# Patient Record
Sex: Female | Born: 1935 | Race: White | Hispanic: No | Smoking: Former smoker
Health system: Southern US, Community
[De-identification: ages and names within clinical notes are randomized; demographics above are authoritative.]

## PROBLEM LIST (undated history)

## (undated) DIAGNOSIS — Z8619 Personal history of other infectious and parasitic diseases: Secondary | ICD-10-CM

## (undated) DIAGNOSIS — E785 Hyperlipidemia, unspecified: Secondary | ICD-10-CM

## (undated) DIAGNOSIS — Z86718 Personal history of other venous thrombosis and embolism: Secondary | ICD-10-CM

## (undated) DIAGNOSIS — R32 Unspecified urinary incontinence: Secondary | ICD-10-CM

## (undated) DIAGNOSIS — Z87440 Personal history of urinary (tract) infections: Secondary | ICD-10-CM

## (undated) DIAGNOSIS — IMO0001 Reserved for inherently not codable concepts without codable children: Secondary | ICD-10-CM

## (undated) DIAGNOSIS — H3530 Unspecified macular degeneration: Secondary | ICD-10-CM

## (undated) DIAGNOSIS — I10 Essential (primary) hypertension: Secondary | ICD-10-CM

## (undated) DIAGNOSIS — M1990 Unspecified osteoarthritis, unspecified site: Secondary | ICD-10-CM

## (undated) DIAGNOSIS — H9190 Unspecified hearing loss, unspecified ear: Secondary | ICD-10-CM

## (undated) DIAGNOSIS — E119 Type 2 diabetes mellitus without complications: Secondary | ICD-10-CM

## (undated) HISTORY — DX: Hyperlipidemia, unspecified: E78.5

## (undated) HISTORY — DX: Unspecified osteoarthritis, unspecified site: M19.90

## (undated) HISTORY — DX: Personal history of urinary (tract) infections: Z87.440

## (undated) HISTORY — DX: Reserved for inherently not codable concepts without codable children: IMO0001

## (undated) HISTORY — DX: Personal history of other venous thrombosis and embolism: Z86.718

## (undated) HISTORY — DX: Unspecified urinary incontinence: R32

## (undated) HISTORY — DX: Essential (primary) hypertension: I10

## (undated) HISTORY — DX: Unspecified macular degeneration: H35.30

## (undated) HISTORY — DX: Type 2 diabetes mellitus without complications: E11.9

## (undated) HISTORY — DX: Personal history of other infectious and parasitic diseases: Z86.19

## (undated) HISTORY — DX: Unspecified hearing loss, unspecified ear: H91.90

## (undated) HISTORY — PX: TONSILLECTOMY AND ADENOIDECTOMY: SUR1326

---

## 1983-06-14 HISTORY — PX: ABDOMINAL HYSTERECTOMY: SHX81

## 2000-04-13 ENCOUNTER — Other Ambulatory Visit: Admission: RE | Admit: 2000-04-13 | Discharge: 2000-04-13 | Payer: Self-pay | Admitting: Family Medicine

## 2005-01-03 ENCOUNTER — Ambulatory Visit: Payer: Self-pay | Admitting: Ophthalmology

## 2005-01-12 ENCOUNTER — Ambulatory Visit: Payer: Self-pay | Admitting: Ophthalmology

## 2005-02-17 ENCOUNTER — Ambulatory Visit: Payer: Self-pay | Admitting: Ophthalmology

## 2005-02-23 ENCOUNTER — Ambulatory Visit: Payer: Self-pay | Admitting: Ophthalmology

## 2005-06-23 ENCOUNTER — Ambulatory Visit: Payer: Self-pay | Admitting: Internal Medicine

## 2005-07-04 ENCOUNTER — Ambulatory Visit: Payer: Self-pay | Admitting: Gastroenterology

## 2006-07-21 ENCOUNTER — Ambulatory Visit: Payer: Self-pay | Admitting: Internal Medicine

## 2007-04-04 ENCOUNTER — Ambulatory Visit: Payer: Self-pay | Admitting: Internal Medicine

## 2007-04-04 ENCOUNTER — Inpatient Hospital Stay: Payer: Self-pay | Admitting: Internal Medicine

## 2007-07-25 ENCOUNTER — Ambulatory Visit: Payer: Self-pay | Admitting: Internal Medicine

## 2007-11-27 ENCOUNTER — Inpatient Hospital Stay: Payer: Self-pay | Admitting: Internal Medicine

## 2007-11-27 ENCOUNTER — Other Ambulatory Visit: Payer: Self-pay

## 2008-07-31 ENCOUNTER — Ambulatory Visit: Payer: Self-pay | Admitting: Internal Medicine

## 2009-01-12 ENCOUNTER — Ambulatory Visit: Payer: Self-pay | Admitting: Sports Medicine

## 2009-06-13 DIAGNOSIS — R32 Unspecified urinary incontinence: Secondary | ICD-10-CM

## 2009-06-13 HISTORY — DX: Unspecified urinary incontinence: R32

## 2009-08-05 ENCOUNTER — Ambulatory Visit: Payer: Self-pay | Admitting: Internal Medicine

## 2009-09-17 ENCOUNTER — Emergency Department: Payer: Self-pay | Admitting: Emergency Medicine

## 2009-09-17 ENCOUNTER — Ambulatory Visit: Payer: Self-pay | Admitting: Internal Medicine

## 2010-03-11 ENCOUNTER — Ambulatory Visit: Payer: Self-pay | Admitting: Internal Medicine

## 2010-05-05 ENCOUNTER — Ambulatory Visit: Payer: Self-pay | Admitting: Nephrology

## 2010-06-13 HISTORY — PX: ANKLE FRACTURE SURGERY: SHX122

## 2010-07-23 ENCOUNTER — Ambulatory Visit: Payer: Self-pay

## 2010-08-26 ENCOUNTER — Ambulatory Visit: Payer: Self-pay | Admitting: Internal Medicine

## 2011-01-21 ENCOUNTER — Inpatient Hospital Stay: Payer: Self-pay | Admitting: Orthopedic Surgery

## 2011-01-21 DIAGNOSIS — I10 Essential (primary) hypertension: Secondary | ICD-10-CM

## 2011-06-14 DIAGNOSIS — H3530 Unspecified macular degeneration: Secondary | ICD-10-CM

## 2011-06-14 HISTORY — DX: Unspecified macular degeneration: H35.30

## 2011-07-14 ENCOUNTER — Encounter: Payer: Medicare Other | Admitting: Family Medicine

## 2011-07-15 ENCOUNTER — Ambulatory Visit (INDEPENDENT_AMBULATORY_CARE_PROVIDER_SITE_OTHER): Payer: Medicare Other | Admitting: Family Medicine

## 2011-07-15 ENCOUNTER — Encounter: Payer: Self-pay | Admitting: Family Medicine

## 2011-07-15 ENCOUNTER — Other Ambulatory Visit: Payer: Self-pay | Admitting: Family Medicine

## 2011-07-15 ENCOUNTER — Ambulatory Visit: Payer: Self-pay | Admitting: Unknown Physician Specialty

## 2011-07-15 ENCOUNTER — Ambulatory Visit: Payer: Medicare Other | Admitting: Family Medicine

## 2011-07-15 VITALS — BP 168/98 | HR 80 | Temp 98.3°F | Wt 168.2 lb

## 2011-07-15 DIAGNOSIS — I10 Essential (primary) hypertension: Secondary | ICD-10-CM | POA: Insufficient documentation

## 2011-07-15 DIAGNOSIS — M1990 Unspecified osteoarthritis, unspecified site: Secondary | ICD-10-CM | POA: Insufficient documentation

## 2011-07-15 DIAGNOSIS — H9190 Unspecified hearing loss, unspecified ear: Secondary | ICD-10-CM

## 2011-07-15 DIAGNOSIS — Z7901 Long term (current) use of anticoagulants: Secondary | ICD-10-CM

## 2011-07-15 DIAGNOSIS — R32 Unspecified urinary incontinence: Secondary | ICD-10-CM | POA: Insufficient documentation

## 2011-07-15 DIAGNOSIS — E119 Type 2 diabetes mellitus without complications: Secondary | ICD-10-CM | POA: Insufficient documentation

## 2011-07-15 DIAGNOSIS — Z9181 History of falling: Secondary | ICD-10-CM

## 2011-07-15 DIAGNOSIS — Z5181 Encounter for therapeutic drug level monitoring: Secondary | ICD-10-CM

## 2011-07-15 DIAGNOSIS — E785 Hyperlipidemia, unspecified: Secondary | ICD-10-CM

## 2011-07-15 DIAGNOSIS — I82409 Acute embolism and thrombosis of unspecified deep veins of unspecified lower extremity: Secondary | ICD-10-CM

## 2011-07-15 DIAGNOSIS — B353 Tinea pedis: Secondary | ICD-10-CM

## 2011-07-15 DIAGNOSIS — R296 Repeated falls: Secondary | ICD-10-CM

## 2011-07-15 DIAGNOSIS — M129 Arthropathy, unspecified: Secondary | ICD-10-CM

## 2011-07-15 DIAGNOSIS — Z86718 Personal history of other venous thrombosis and embolism: Secondary | ICD-10-CM | POA: Insufficient documentation

## 2011-07-15 LAB — POCT INR: INR: 2.9

## 2011-07-15 MED ORDER — CARVEDILOL 6.25 MG PO TABS: 6.2500 mg | ORAL_TABLET | Freq: Two times a day (BID) | ORAL | Status: DC

## 2011-07-15 NOTE — Patient Instructions (Signed)
Continue current dose, check in 4 weeks  

## 2011-07-15 NOTE — Assessment & Plan Note (Signed)
Await records, compliant with lovastatin.

## 2011-07-15 NOTE — Patient Instructions (Addendum)
Coumadin check today - goal INR 2-3. Stop tramadol. Take tylenol 1000mg  twice daily as needed instead. Use lotrimin between last two toes on left for possible early fungal infection. Pass by Marion's office for referral to home physical therapy. Increase carvedilol to 2 pills twice daily, new prescription will be for double dose (6.25) so one pill twice daily. Return to see me in 1 month, blood work then.

## 2011-07-15 NOTE — Progress Notes (Addendum)
Subjective:    Patient ID: Margaret Haynes, female    DOB: 1927/01/23, 78 y.o.   MRN: [id number]  HPI CC: new pt establish  Somewhat hard of hearing.  Prior saw Dr. Hetty Ely, then switched to Dr. Vickii Penna [facility] medical), not satisfied with care, was seeing PA/NP, wants to change providers.  Will pull old chart --> none available.  Presents with two daughters, Eather Colas and Liborio Nixon.  Homebound 2/2 fall risk.  Daughters worried about pt's ability to live at home alone.  Son intermittently stays with her at night but unsure how long he will be able to do this.  HTN - lumbar MRI this morning.  Walking more than normal.  Also meeting new doc.  Thinks has been well controlled in past on low dose coreg.  Does n't check at home.  No HA, vision changes, CP/tightness, SOB, leg swelling.  T2DM - diagnosed "some years ago", checks sugars qod.  Fasting 120s.  Last vision exam 09/2010.  Already scheduled for rpt.  Last foot exam - unsure.  No low readings.  Has never been on metformin or insulin.  HLD - on lovastatin.  No myalgias endorsed.  H/o DVT bilateral legs first 2011 then 2012.  On chronic coumadin for this.  Last INR check was 2.5 and done 06/20/2011.  Recurrent falls - Has had falls recently.  Last fall without injury was last week.  Fell and landed sitting on floor in bathroom.  Last fall with injury was 01/2011, broke both bones in right ankle.  S/p surgery with metal plates and screws present.  Ortho Dr. Gerrit Heck, wondering about spinal stenosis, MRI of back done today, considering ESI.  Started using walker last year.  Has completed HHPT several times.  In fall was in [facility] for rehab for 6 wks after ankle surgery, sent home 03/18/2011.  Currently working on getting HH involved again 2/2 recurrent falls.  Again, daughters worried about pt's ability to stay at home.  Denies dizziness, lightheadedness with falls.  States legs give out bilaterally, denies unilateral weakness,  paresthesias.  Preventative: Unsure last wellness exam, none recently. Blood work getting done every 3 months by prior PCP. DEXA - thinks done by prior PCP but never told had osteoporosis. Flu - 04/2011 Pneumonia shot - done Tetanus - unsure when, zostavax - has not had. mammo - 08/2010, gets yearly  Caffeine: 2 cups coffee/day Lives alone, son stays with her most nights, no pets Occupation: retired Activity: no regular exercise Diet: good water, daily fruits/vegetables, red meat rare  Medications and allergies reviewed and updated in chart.  Past histories reviewed and updated if relevant as below. There is no problem list on file for this patient.  Past Medical History  Diagnosis Date  . Arthritis     in back, ?remote vertebral fracture  . History of chicken pox   . T2DM (type 2 diabetes mellitus)   . HTN (hypertension)   . HLD (hyperlipidemia)   . History of DVT (deep vein thrombosis) 2011, 2012    bilateral legs  . Urine incontinence 2011  . Hx: UTI (urinary tract infection)     recurrent   Past Surgical History  Procedure Date  . Tonsillectomy and adenoidectomy childhood  . Abdominal hysterectomy 1985    bleeding after menopause?  Marland Kitchen Ankle fracture surgery 2012   History  Substance Use Topics  . Smoking status: Former Smoker    Quit date: 06/14/1959  . Smokeless tobacco: Never Used  . Alcohol  Use: No   Family History  Problem Relation Age of Onset  . Heart disease Mother   . Coronary artery disease Mother   . Coronary artery disease Father   . Drug abuse Father   . Diabetes Father   . Diabetes Sister   . Coronary artery disease Sister   . Cancer Brother   . Stroke Neg Hx    No Known Allergies No current outpatient prescriptions on file prior to visit.     Review of Systems  Constitutional: Negative for fever, chills, activity change, appetite change, fatigue and unexpected weight change.  HENT: Negative for hearing loss and neck pain.   Eyes:  Negative for visual disturbance.  Respiratory: Negative for cough, chest tightness, shortness of breath and wheezing.   Cardiovascular: Negative for chest pain, palpitations and leg swelling.  Gastrointestinal: Negative for nausea, vomiting, abdominal pain, diarrhea, constipation, blood in stool and abdominal distention.  Genitourinary: Negative for hematuria and difficulty urinating.  Musculoskeletal: Negative for myalgias and arthralgias.  Skin: Negative for rash.  Neurological: Negative for dizziness, seizures, syncope and headaches.  Hematological: Does not bruise/bleed easily.  Psychiatric/Behavioral: Negative for dysphoric mood. The patient is not nervous/anxious.        Objective:   Physical Exam  Nursing note and vitals reviewed. Constitutional: She is oriented to person, place, and time. She appears well-developed and well-nourished. No distress.       Unsteady on feet, overweight  HENT:  Head: Normocephalic and atraumatic.  Right Ear: Tympanic membrane, external ear and ear canal normal. Decreased hearing is noted.  Left Ear: Tympanic membrane, external ear and ear canal normal. Decreased hearing is noted.  Nose: Nose normal. No mucosal edema or rhinorrhea.  Mouth/Throat: Uvula is midline, oropharynx is clear and moist and mucous membranes are normal. No oropharyngeal exudate, posterior oropharyngeal edema, posterior oropharyngeal erythema or tonsillar abscesses.       Hearing aides present bilaterally  Eyes: Conjunctivae and EOM are normal. Pupils are equal, round, and reactive to light. No scleral icterus.  Neck: Normal range of motion. Neck supple. Carotid bruit is not present. No thyromegaly present.  Cardiovascular: Normal rate, regular rhythm, normal heart sounds and intact distal pulses.   No murmur heard. Pulses:      Radial pulses are 2+ on the right side, and 2+ on the left side.       Dorsalis pedis pulses are 2+ on the right side, and 2+ on the left side.        Posterior tibial pulses are 2+ on the right side, and 2+ on the left side.  Pulmonary/Chest: Effort normal and breath sounds normal. No respiratory distress. She has no wheezes. She has no rales.  Abdominal: Soft. Bowel sounds are normal. She exhibits no distension and no mass. There is no tenderness. There is no rebound and no guarding.  Musculoskeletal: Normal range of motion. She exhibits no edema.       Diabetic foot exam Deformity bilateral feet. Some skin breakdown left interdigital web space between 4th and 5th toes calluses present Normal DP/PT pulses Normal sensation to light touch and slightly diminised to monofilament Nails thickened, fungal  Lymphadenopathy:    She has no cervical adenopathy.  Neurological: She is alert and oriented to person, place, and time. She exhibits normal muscle tone. Coordination abnormal.       CN grossly intact, station and gait impaired.  Walks with rolling walker, very unsteady on feet, complete assistance needed to get on exam  table  Skin: Skin is warm and dry. No rash noted.       Left interdigital web between 4th-5th toes with maceration, scaling  Psychiatric: She has a normal mood and affect. Her behavior is normal. Judgment and thought content normal.      Assessment & Plan:

## 2011-07-16 DIAGNOSIS — B353 Tinea pedis: Secondary | ICD-10-CM | POA: Insufficient documentation

## 2011-07-16 NOTE — Assessment & Plan Note (Signed)
Goal INR 2-3 Get established with coumadin clinic today.

## 2011-07-16 NOTE — Assessment & Plan Note (Signed)
rec lotrimin cream.

## 2011-07-16 NOTE — Assessment & Plan Note (Signed)
Chronic. Seems well controlled on low dose amaryl per fasting cbgs reported.   Will do foot exam today, request records from prior pcp and check blood possibly next visit.

## 2011-07-16 NOTE — Assessment & Plan Note (Signed)
Elevated today but has been exherted more than normal today, new doctor office today. Increase coreg slightly today, await records

## 2011-07-16 NOTE — Assessment & Plan Note (Addendum)
Will refer to [facility] today for PT for home safety assessment and for assistance/treatment of gait instability, help minimize fall risk, social work, and nurse aid to help with meds and ensure taking appropriately. Anticipate recent ankle fracture s/p repair contributing some to recurrent falls. Med review performed - have asked pt stop tramadol (2/2 concern with falls and interaction with coumadin), asked to take tylenol for arthritis in back instead. A total of 60 minutes were spent face-to-face with the patient during this encounter and over half of that time was spent on counseling and coordination of care

## 2011-07-16 NOTE — Assessment & Plan Note (Addendum)
?   Remote vertebral fractures.  Will need to verify recent dexa scan, ?osteoporosis Await records from Dr. Gerrit Heck.

## 2011-07-18 ENCOUNTER — Ambulatory Visit: Payer: Medicare Other

## 2011-07-18 ENCOUNTER — Encounter: Payer: Medicare Other | Admitting: Family Medicine

## 2011-07-21 ENCOUNTER — Telehealth: Payer: Self-pay | Admitting: Family Medicine

## 2011-07-21 NOTE — Telephone Encounter (Signed)
Patient sees Dr.Chasnis for back pain.  She will be receiving epidural injections.  She needs to discontinue her coumadin 4 days prior to the injections.  Dr.Chasnis' office called her daughter and asked her to call you and ask you to fax a letter to them stating it's ok for patient to discontinue coumadin 4 days prior to injection.  The fax [phone].

## 2011-07-21 NOTE — Telephone Encounter (Signed)
Wrote letter and routed to [employer].

## 2011-07-25 NOTE — Telephone Encounter (Signed)
Note faxed to Dr. Yves Dill' office.

## 2011-08-12 ENCOUNTER — Other Ambulatory Visit: Payer: Self-pay | Admitting: Family Medicine

## 2011-08-12 ENCOUNTER — Ambulatory Visit (INDEPENDENT_AMBULATORY_CARE_PROVIDER_SITE_OTHER): Payer: Medicare Other | Admitting: Family Medicine

## 2011-08-12 ENCOUNTER — Encounter: Payer: Self-pay | Admitting: Family Medicine

## 2011-08-12 VITALS — BP 182/100 | HR 92 | Temp 97.7°F | Wt 158.8 lb

## 2011-08-12 DIAGNOSIS — Z1231 Encounter for screening mammogram for malignant neoplasm of breast: Secondary | ICD-10-CM

## 2011-08-12 DIAGNOSIS — Z9181 History of falling: Secondary | ICD-10-CM

## 2011-08-12 DIAGNOSIS — Z86718 Personal history of other venous thrombosis and embolism: Secondary | ICD-10-CM

## 2011-08-12 DIAGNOSIS — I10 Essential (primary) hypertension: Secondary | ICD-10-CM

## 2011-08-12 DIAGNOSIS — D649 Anemia, unspecified: Secondary | ICD-10-CM

## 2011-08-12 DIAGNOSIS — E119 Type 2 diabetes mellitus without complications: Secondary | ICD-10-CM

## 2011-08-12 DIAGNOSIS — Z7901 Long term (current) use of anticoagulants: Secondary | ICD-10-CM

## 2011-08-12 DIAGNOSIS — Z5181 Encounter for therapeutic drug level monitoring: Secondary | ICD-10-CM

## 2011-08-12 DIAGNOSIS — E785 Hyperlipidemia, unspecified: Secondary | ICD-10-CM

## 2011-08-12 DIAGNOSIS — I82409 Acute embolism and thrombosis of unspecified deep veins of unspecified lower extremity: Secondary | ICD-10-CM

## 2011-08-12 DIAGNOSIS — Z1211 Encounter for screening for malignant neoplasm of colon: Secondary | ICD-10-CM

## 2011-08-12 DIAGNOSIS — R296 Repeated falls: Secondary | ICD-10-CM

## 2011-08-12 LAB — BASIC METABOLIC PANEL
CO2: 28 mEq/L (ref 19–32)
Calcium: 9.2 mg/dL (ref 8.4–10.5)
Chloride: 103 mEq/L (ref 96–112)
Glucose, Bld: 117 mg/dL — ABNORMAL HIGH (ref 70–99)
Potassium: 3.8 mEq/L (ref 3.5–5.1)
Sodium: 139 mEq/L (ref 135–145)

## 2011-08-12 LAB — CBC WITH DIFFERENTIAL/PLATELET
Basophils Relative: 0.8 % (ref 0.0–3.0)
Eosinophils Absolute: 0.1 10*3/uL (ref 0.0–0.7)
Eosinophils Relative: 1.5 % (ref 0.0–5.0)
Lymphocytes Relative: 22.5 % (ref 12.0–46.0)
MCV: 78.8 fl (ref 78.0–100.0)
Monocytes Absolute: 0.6 10*3/uL (ref 0.1–1.0)
Neutrophils Relative %: 66 % (ref 43.0–77.0)
Platelets: 165 10*3/uL (ref 150.0–400.0)
RBC: 4.7 Mil/uL (ref 3.87–5.11)
WBC: 6.3 10*3/uL (ref 4.5–10.5)

## 2011-08-12 LAB — MICROALBUMIN / CREATININE URINE RATIO
Creatinine,U: 113.5 mg/dL
Microalb Creat Ratio: 8.8 mg/g (ref 0.0–30.0)

## 2011-08-12 LAB — POCT INR: INR: 3.9

## 2011-08-12 LAB — HEMOGLOBIN A1C: Hgb A1c MFr Bld: 6.7 % — ABNORMAL HIGH (ref 4.6–6.5)

## 2011-08-12 LAB — LIPID PANEL: Total CHOL/HDL Ratio: 3

## 2011-08-12 MED ORDER — LISINOPRIL 5 MG PO TABS: 5.0000 mg | ORAL_TABLET | Freq: Every day | ORAL | Status: DC

## 2011-08-12 NOTE — Assessment & Plan Note (Signed)
Never heard from [facility] - pass by Marion's office to check on this today.

## 2011-08-12 NOTE — Assessment & Plan Note (Signed)
Coumadin check today.  

## 2011-08-12 NOTE — Assessment & Plan Note (Signed)
Endorses good control Check blood work today. rtc 1 mo.

## 2011-08-12 NOTE — Assessment & Plan Note (Signed)
Staying elevated BP. Start ACEI. Discussed side effects/allergic rxn to watch out for.

## 2011-08-12 NOTE — Patient Instructions (Signed)
Hold today then start 6 mg daily except 3 mg Sat, recheck 2 weeks

## 2011-08-12 NOTE — Patient Instructions (Addendum)
Coumadin level today. Blood work today. Start blood pressure medicine called lisinopril at 5mg  daily.  Your blood pressure is too high! Call prior doctor's office as I have not received any records yet. Good to see you today, call us with questions. Return in 1 month for a recheck on diabetes and blood pressure. Pass by Marion's office and we will set up physical therapy, social worker and mammogram

## 2011-08-12 NOTE — Progress Notes (Signed)
  Subjective:    Patient ID: Margaret Haynes, female    DOB: [birth year], 78 y.o.   MRN: [id number]  HPI CC: 1 mo f/u HTN, DM  HTN - last visit increased coreg to 6.25mg  bid.  No HA, vision changes, CP/tightness, SOB, leg swelling.  Didn't take coreg this morning.   T2DM - checks sugars qod. Yesterday 118. Last vision exam 09/2010. Already scheduled for rpt. Last foot exam - last month 07/2011.  No low readings. Has never been on metformin or insulin.  On coumadin for h/o recurrent DVTs.  Due for coumadin check today.  Falls and weakness - HHPT never set up.  They state they never heard anything about setting this up.  Review of Systems Per HPI    Objective:   Physical Exam  Nursing note and vitals reviewed. Constitutional: She appears well-developed and well-nourished. No distress.  HENT:  Head: Normocephalic and atraumatic.  Mouth/Throat: Oropharynx is clear and moist. No oropharyngeal exudate.  Eyes: Conjunctivae and EOM are normal. Pupils are equal, round, and reactive to light. No scleral icterus.  Neck: Normal range of motion. Neck supple. Carotid bruit is not present.  Cardiovascular: Normal rate, regular rhythm, normal heart sounds and intact distal pulses.   No murmur heard. Pulmonary/Chest: Effort normal and breath sounds normal. No respiratory distress. She has no wheezes. She has no rales.  Musculoskeletal: She exhibits no edema.  Lymphadenopathy:    She has no cervical adenopathy.  Skin: Skin is warm and dry. No rash noted.  Psychiatric: She has a normal mood and affect.       Assessment & Plan:

## 2011-08-15 ENCOUNTER — Telehealth: Payer: Self-pay | Admitting: *Deleted

## 2011-08-15 NOTE — Telephone Encounter (Signed)
Linda from [facility] called and wanted you to be aware that they have gotten Margaret Haynes admitted to their services. They went out to see her on this past Saturday. She did ask about her INR checks and how they needed to be done. According to what has been done here, it looks like once a month. Can you clarify for me please?

## 2011-08-16 ENCOUNTER — Telehealth: Payer: Self-pay | Admitting: *Deleted

## 2011-08-16 NOTE — Telephone Encounter (Signed)
I had advised her of same and told her I would call if any different. She just wanted to make sure for her paperwork.

## 2011-08-16 NOTE — Telephone Encounter (Signed)
Noted. Actually, INR may be followed here as we have been doing. i don't remember any reason why we would want to have HH do that, unless family unable to bring in monthly.. Thanks.

## 2011-08-16 NOTE — Telephone Encounter (Signed)
Noted thanks °

## 2011-08-16 NOTE — Telephone Encounter (Signed)
Genice with [facility] called and requested verbal order to see patient 2 more times. Daughter feels patient is unsafe at home alone and needs to go to ALF, but patient doesn't want to go. Genice needs 2 more visits to find community resources to help patient stay home and be safe. I gave verbal order. You should receive updates.

## 2011-08-26 ENCOUNTER — Telehealth: Payer: Self-pay | Admitting: *Deleted

## 2011-08-26 ENCOUNTER — Ambulatory Visit (INDEPENDENT_AMBULATORY_CARE_PROVIDER_SITE_OTHER): Payer: Medicare Other | Admitting: Family Medicine

## 2011-08-26 DIAGNOSIS — I10 Essential (primary) hypertension: Secondary | ICD-10-CM

## 2011-08-26 DIAGNOSIS — E119 Type 2 diabetes mellitus without complications: Secondary | ICD-10-CM

## 2011-08-26 DIAGNOSIS — Z86718 Personal history of other venous thrombosis and embolism: Secondary | ICD-10-CM

## 2011-08-26 DIAGNOSIS — R262 Difficulty in walking, not elsewhere classified: Secondary | ICD-10-CM

## 2011-08-26 DIAGNOSIS — M159 Polyosteoarthritis, unspecified: Secondary | ICD-10-CM

## 2011-08-26 LAB — POCT INR: INR: 1.9

## 2011-08-26 NOTE — Telephone Encounter (Signed)
Patient came in this morning with her daughter for lab work and her daughter asked that patients BP be checked.  She stated that patient was a little confused this morning and was saying things that didn't make any sense.  Patients BP today in the office was 158/80.  I asked patient how she felt and she stated that she feels fine.  She understood all the questions I asked and responded appropriately.  Patients daughter stated that this morning was the first time that she can remember her mom doing this.  I advised patient to keep her f/u appt with Dr. Sharen Hones and to call us with any changes/updates.

## 2011-08-26 NOTE — Telephone Encounter (Signed)
Genise called stating that she has discharged patient from social work services.  She has placed a referral for long term care services.

## 2011-08-26 NOTE — Patient Instructions (Signed)
start 6 mg, recheck 2 weeks

## 2011-08-26 NOTE — Telephone Encounter (Signed)
Noted. Thanks.

## 2011-09-08 ENCOUNTER — Ambulatory Visit: Payer: Medicare Other

## 2011-09-13 ENCOUNTER — Encounter: Payer: Self-pay | Admitting: Family Medicine

## 2011-09-13 ENCOUNTER — Ambulatory Visit (INDEPENDENT_AMBULATORY_CARE_PROVIDER_SITE_OTHER): Payer: Medicare Other | Admitting: Family Medicine

## 2011-09-13 VITALS — BP 140/90 | HR 77 | Temp 97.8°F | Wt 164.0 lb

## 2011-09-13 DIAGNOSIS — I82409 Acute embolism and thrombosis of unspecified deep veins of unspecified lower extremity: Secondary | ICD-10-CM

## 2011-09-13 DIAGNOSIS — Z5181 Encounter for therapeutic drug level monitoring: Secondary | ICD-10-CM

## 2011-09-13 DIAGNOSIS — Z86718 Personal history of other venous thrombosis and embolism: Secondary | ICD-10-CM

## 2011-09-13 DIAGNOSIS — Z7901 Long term (current) use of anticoagulants: Secondary | ICD-10-CM

## 2011-09-13 DIAGNOSIS — R011 Cardiac murmur, unspecified: Secondary | ICD-10-CM

## 2011-09-13 DIAGNOSIS — I10 Essential (primary) hypertension: Secondary | ICD-10-CM

## 2011-09-13 MED ORDER — LISINOPRIL 10 MG PO TABS: 10.0000 mg | ORAL_TABLET | Freq: Every day | ORAL | Status: DC

## 2011-09-13 MED ORDER — LOVASTATIN 20 MG PO TABS: 20.0000 mg | ORAL_TABLET | Freq: Every day | ORAL | Status: AC

## 2011-09-13 MED ORDER — CARVEDILOL 6.25 MG PO TABS: 6.2500 mg | ORAL_TABLET | Freq: Two times a day (BID) | ORAL | Status: DC

## 2011-09-13 MED ORDER — GLIMEPIRIDE 1 MG PO TABS: 1.0000 mg | ORAL_TABLET | Freq: Every day | ORAL | Status: DC

## 2011-09-13 MED ORDER — WARFARIN SODIUM 6 MG PO TABS: 6.0000 mg | ORAL_TABLET | Freq: Every day | ORAL | Status: DC

## 2011-09-13 NOTE — Assessment & Plan Note (Signed)
Receiving HHPT.

## 2011-09-13 NOTE — Patient Instructions (Signed)
Blood pressure staying a bit high - increase lisinopril to 10mg  daily (2 pills until new script is picked up). ROI for records from Dr. Milta Deiters office. Good to see you today, call us with quesitons. Return in 2-3 months for follow up, sooner if needed. Coumadin check today. Return in 2-3 weeks for blood work (kidney check with increasing lisinopril).

## 2011-09-13 NOTE — Patient Instructions (Signed)
Hold next 2 doses, then start 6 mg daily, 3 mg Wed, Sat, recheck 2 weeks

## 2011-09-13 NOTE — Progress Notes (Signed)
  Subjective:    Patient ID: Margaret Haynes, female    DOB: [birth year], 78 y.o.   MRN: [id number]  HPI CC: 1 mo f/u HTN, DM  No questions or concerns today.  Back much better since ESI injections.  HTN - No HA, vision changes, CP/tightness, SOB, leg swelling. On coreg bid and lisinopril 5 mg daily.  T2DM - checks sugars qod. Thinks runs well but forgot to bring log. Has vision check scheduled for 09/2011.  Last foot exam - last month 07/2011. No hypoglycemic episodes.  Has never been on metformin or insulin.  Lab Results  Component Value Date   HGBA1C 6.7* 08/12/2011    On coumadin for h/o recurrent DVTs. Due for coumadin check today.   Falls and weakness - Seen by [facility] for PT, working on balance training and gait safety.  Using rolling walker.  Past Medical History  Diagnosis Date  . Arthritis     in back, ?remote vertebral fracture  . History of chicken pox   . T2DM (type 2 diabetes mellitus)   . HTN (hypertension)   . HLD (hyperlipidemia)   . History of DVT (deep vein thrombosis) 2011, 2012    bilateral legs  . Urine incontinence 2011  . Hx: UTI (urinary tract infection)     recurrent  . Hearing impaired     bilateral hearing aides, last checked 06/2011    Review of Systems Per HPI    Objective:   Physical Exam  Nursing note and vitals reviewed. Constitutional: She appears well-developed and well-nourished. No distress.  HENT:  Head: Normocephalic and atraumatic.  Mouth/Throat: Oropharynx is clear and moist. No oropharyngeal exudate.  Eyes: Conjunctivae and EOM are normal. Pupils are equal, round, and reactive to light. No scleral icterus.  Neck: Normal range of motion. Neck supple. Carotid bruit is not present.  Cardiovascular: Normal rate, regular rhythm and intact distal pulses.   Murmur (2/6 SEM best at LUSB) heard. Pulmonary/Chest: Effort normal and breath sounds normal. No respiratory distress. She has no wheezes. She has no rales.    Musculoskeletal: She exhibits edema (tr pitting edema).  Skin: Skin is warm and dry. No rash noted.  Psychiatric: She has a normal mood and affect.       Assessment & Plan:

## 2011-09-13 NOTE — Assessment & Plan Note (Signed)
New.  ?AS. Will review records from prior PCP If remaining next visit, obtain echo.

## 2011-09-13 NOTE — Assessment & Plan Note (Signed)
Chronic. Improved control but not at goal.  Increase ACEI. rtc 2-3 wks for Cr check on increased ACEI.

## 2011-09-13 NOTE — Assessment & Plan Note (Signed)
Chronic. Lab Results  Component Value Date   HGBA1C 6.7* 08/12/2011   good control on amaryl. Awaiting records from prior PCP.

## 2011-09-26 ENCOUNTER — Ambulatory Visit: Payer: Medicare Other | Admitting: Family Medicine

## 2011-09-26 ENCOUNTER — Ambulatory Visit: Payer: Medicare Other

## 2011-09-26 ENCOUNTER — Emergency Department: Payer: Self-pay | Admitting: Emergency Medicine

## 2011-09-26 LAB — CBC
MCH: 24.6 pg — ABNORMAL LOW (ref 26.0–34.0)
MCV: 79 fL
MCV: 79 fL — ABNORMAL LOW (ref 80–100)
Platelet: 141 10*3/uL — ABNORMAL LOW (ref 150–440)
RBC: 4.63 10*6/uL (ref 3.80–5.20)
RDW: 19 % — ABNORMAL HIGH (ref 11.5–14.5)
WBC: 5.6
WBC: 5.6 10*3/uL (ref 3.6–11.0)
platelet count: 141

## 2011-09-26 LAB — COMPREHENSIVE METABOLIC PANEL
ALT: 22 U/L (ref 7–35)
AST: 20 U/L
Albumin: 3.7
Albumin: 3.7 g/dL (ref 3.4–5.0)
Alkaline Phosphatase: 68 U/L (ref 50–136)
Anion Gap: 7 (ref 7–16)
BUN: 21 mg/dL — ABNORMAL HIGH (ref 7–18)
Bilirubin,Total: 0.2 mg/dL (ref 0.2–1.0)
Calcium, Total: 9.2 mg/dL (ref 8.5–10.1)
Chloride: 106 mmol/L (ref 98–107)
Co2: 32 mmol/L (ref 21–32)
Creatinine: 0.84 mg/dL (ref 0.60–1.30)
Glucose: 75 mg/dL (ref 65–99)
Osmolality: 290 (ref 275–301)
SGOT(AST): 20 U/L (ref 15–37)
Total Bilirubin: 0.2 mg/dL
Total Protein: 7.6 g/dL
Total Protein: 7.6 g/dL (ref 6.4–8.2)

## 2011-09-26 LAB — PROTIME-INR: INR: 2.2

## 2011-09-26 LAB — PRO B NATRIURETIC PEPTIDE: B-Type Natriuretic Peptide: 381 pg/mL (ref 0–450)

## 2011-09-26 LAB — URINALYSIS, COMPLETE
Bilirubin,UR: NEGATIVE
Ketone: NEGATIVE
Ph: 8 (ref 4.5–8.0)
Protein: NEGATIVE
Specific Gravity: 1.005 (ref 1.003–1.030)
Squamous Epithelial: 2

## 2011-09-27 ENCOUNTER — Telehealth: Payer: Self-pay

## 2011-09-27 MED ORDER — LISINOPRIL 5 MG PO TABS: 5.0000 mg | ORAL_TABLET | Freq: Every day | ORAL | Status: DC

## 2011-09-27 NOTE — Telephone Encounter (Signed)
Delores notified. She will have patient decrease lisinopril and stay well hydrated. She is aware to come in for eval if no improvement after dose decrease.

## 2011-09-27 NOTE — Telephone Encounter (Signed)
I would like [place] to back down on lisinopril to 5mg  daily (had increased to 10mg  daily).  Sent in new dose. If dizziness not resolved with this, to return for OV. Ensure getting plenty of water - needs to stay well hydrated on this medicine.

## 2011-09-27 NOTE — Telephone Encounter (Signed)
Margaret Haynes pts daughter said that pt saw Dr Sharen Hones on 09/13/11 and BP med was increased. Pt went to [facility] on 09/26/11 by ambulance for dizziness. [facility] evaluated and diagnosed for either dehydration or increase in BP med. Orthostatics did go down when BP taken with pt standing up. Pt drinking more water. Pt had dizziness again this morning when she first got up. Pt said she feels OK now. Margaret Haynes wanted to know if pt needs to be seen or decrease BP med. Margaret Haynes is afraid pt may fall due to dizziness.Please call Margaret Haynes at [phone]. If pharmacy needed [address]

## 2011-09-30 ENCOUNTER — Ambulatory Visit: Payer: Self-pay | Admitting: Family Medicine

## 2011-10-03 ENCOUNTER — Encounter: Payer: Self-pay | Admitting: Family Medicine

## 2011-10-04 ENCOUNTER — Encounter: Payer: Self-pay | Admitting: *Deleted

## 2011-10-05 ENCOUNTER — Telehealth: Payer: Self-pay | Admitting: *Deleted

## 2011-10-05 MED ORDER — ENALAPRIL MALEATE 2.5 MG PO TABS: 2.5000 mg | ORAL_TABLET | Freq: Every day | ORAL | Status: DC

## 2011-10-05 NOTE — Telephone Encounter (Signed)
Patient's daughter called. She said that patient was still complaining of dizziness even with decreasing the lisinopril. Her family stopped the lisinopril completely and she is no longer dizzy. She had an appt scheduled for tomorrow to discuss the dizziness, but her daughter asked if she needed the appt since stopping the med stopped the dizziness. She was asking if you could just send in a different medication.   She was also asking how frequently she need to have her INR checked. She doesn't have another appt scheduled to see you for 3 months and wasn't told when to come back for INR.

## 2011-10-05 NOTE — Telephone Encounter (Signed)
Last INR done 4/2 was 3.8, said return 2 wks for recheck, due for return as blood was too thin last visit. Regarding lisinopril - ok to stop.  Sent in low dose enalapril to try.  May cancel appt, but schedule if dizziness returns. BP Readings from Last 3 Encounters:  09/13/11 140/90  08/12/11 182/100  07/15/11 168/98     Will route note to terri re: rpt INR as fyi.

## 2011-10-05 NOTE — Telephone Encounter (Signed)
Spoke with Margaret Haynes and advised of new Rx. Advised if dizziness returns, pt will need OV. She verbalized understanding. Cancelled appt for tomorrow. Advised she was due for INR check as she was too thin last time. She said they checked it in the hospital and it was fine-she thought 2.4. Advised that I would request records and let her know when the next protime should be based on those results. She verbalized understanding.

## 2011-10-06 ENCOUNTER — Ambulatory Visit: Payer: Medicare Other | Admitting: Family Medicine

## 2011-10-06 NOTE — Telephone Encounter (Signed)
reviewdd ER records from [facility] -  Dx orthostatic hypotension. UA normal Hgb 11.4, MCV 79, plt 141 Glu 75, Cr 0.84 INR 2.2, Tn I 0.02 BNP 381 Given this reading, I would like to have pt return in 2 wks for recheck coumadin level

## 2011-10-07 NOTE — Telephone Encounter (Signed)
Message left for Delores to schedule protime appt in 2 weeks.

## 2011-10-10 ENCOUNTER — Telehealth: Payer: Self-pay

## 2011-10-10 NOTE — Telephone Encounter (Signed)
Stop ACEI - both caused dizziness. Are they able to keep track of BP at home - if so, I'd like them to keep track over next week and call me with numbers to see what we need to do re BP.Marland Kitchen

## 2011-10-10 NOTE — Telephone Encounter (Signed)
Margaret Haynes left v/m Pt had to stop Lisinopril due to dizziness and  (Enalapril) was called in and has caused dizziness also.  Margaret said need different med for BP or does pt need to be seen? [facility] [address] is pharmacy if needed and Margaret can be reached at [phone].

## 2011-10-11 ENCOUNTER — Encounter: Payer: Self-pay | Admitting: Family Medicine

## 2011-10-11 NOTE — Telephone Encounter (Signed)
Noted. Thanks.

## 2011-10-11 NOTE — Telephone Encounter (Signed)
Spoke with [employer]. Advised to stop Enalapril. Advised to keep log of BP's for 1 week and then call me with readings. She did say that Ms. Taff had + orthostatics in the hospital. I advised that it could be from meds, from dehydration or from another cause. Advised to take BP when sitting and then have patient stand for 2 minutes or longer and then recheck BP while standing. Also advised to make sure that Ms. Kiesel is drinking plenty of water. She will do this x 1 week and call me with the results for further advice.

## 2011-10-21 ENCOUNTER — Ambulatory Visit (INDEPENDENT_AMBULATORY_CARE_PROVIDER_SITE_OTHER): Payer: Medicare Other | Admitting: Family Medicine

## 2011-10-21 DIAGNOSIS — Z5181 Encounter for therapeutic drug level monitoring: Secondary | ICD-10-CM

## 2011-10-21 DIAGNOSIS — I82409 Acute embolism and thrombosis of unspecified deep veins of unspecified lower extremity: Secondary | ICD-10-CM

## 2011-10-21 DIAGNOSIS — Z86718 Personal history of other venous thrombosis and embolism: Secondary | ICD-10-CM

## 2011-10-21 DIAGNOSIS — Z7901 Long term (current) use of anticoagulants: Secondary | ICD-10-CM

## 2011-10-21 LAB — POCT INR: INR: 2.5

## 2011-10-21 NOTE — Patient Instructions (Signed)
Continue current dose, check in 4 weeks  

## 2011-11-09 ENCOUNTER — Telehealth: Payer: Self-pay

## 2011-11-09 NOTE — Telephone Encounter (Signed)
Ok to do both.    Compression stockings: 20-73mmHg indication V12.51

## 2011-11-09 NOTE — Telephone Encounter (Signed)
Veleta Miners NP with [facility] did home visit; notices decline in lower extremity strength. Request PT order to increase gait stability and strength. Also request rx for compression stockings.Please advise.

## 2011-11-10 NOTE — Telephone Encounter (Signed)
Message left notifying Veleta Miners that PT and compression stockings were fine. Instructed to call with any further questions or concerns.

## 2011-11-18 ENCOUNTER — Ambulatory Visit (INDEPENDENT_AMBULATORY_CARE_PROVIDER_SITE_OTHER): Payer: Medicare Other | Admitting: Family Medicine

## 2011-11-18 DIAGNOSIS — Z7901 Long term (current) use of anticoagulants: Secondary | ICD-10-CM

## 2011-11-18 DIAGNOSIS — I82409 Acute embolism and thrombosis of unspecified deep veins of unspecified lower extremity: Secondary | ICD-10-CM

## 2011-11-18 DIAGNOSIS — Z86718 Personal history of other venous thrombosis and embolism: Secondary | ICD-10-CM

## 2011-11-18 DIAGNOSIS — Z5181 Encounter for therapeutic drug level monitoring: Secondary | ICD-10-CM

## 2011-11-18 NOTE — Patient Instructions (Signed)
Continue current dose, check in 4 weeks  

## 2011-12-08 ENCOUNTER — Encounter: Payer: Self-pay | Admitting: Family Medicine

## 2011-12-16 ENCOUNTER — Ambulatory Visit: Payer: Medicare Other | Admitting: Family Medicine

## 2011-12-20 ENCOUNTER — Ambulatory Visit: Payer: Medicare Other

## 2011-12-20 ENCOUNTER — Encounter: Payer: Self-pay | Admitting: Family Medicine

## 2011-12-20 ENCOUNTER — Ambulatory Visit (INDEPENDENT_AMBULATORY_CARE_PROVIDER_SITE_OTHER): Payer: Medicare Other | Admitting: Family Medicine

## 2011-12-20 VITALS — BP 124/90 | HR 68 | Temp 98.0°F | Wt 167.2 lb

## 2011-12-20 DIAGNOSIS — E119 Type 2 diabetes mellitus without complications: Secondary | ICD-10-CM

## 2011-12-20 DIAGNOSIS — E785 Hyperlipidemia, unspecified: Secondary | ICD-10-CM

## 2011-12-20 DIAGNOSIS — Z9181 History of falling: Secondary | ICD-10-CM

## 2011-12-20 DIAGNOSIS — I10 Essential (primary) hypertension: Secondary | ICD-10-CM

## 2011-12-20 DIAGNOSIS — Z86718 Personal history of other venous thrombosis and embolism: Secondary | ICD-10-CM

## 2011-12-20 DIAGNOSIS — R296 Repeated falls: Secondary | ICD-10-CM

## 2011-12-20 NOTE — Assessment & Plan Note (Signed)
Chronic. Compliant and tolerant of coreg. Continue.

## 2011-12-20 NOTE — Assessment & Plan Note (Signed)
Chronic.  Recheck a1c today . Foot exam today.  utd vision screen. Continue amaryl. Still no records.  Will ask kim to re-fax ROI.

## 2011-12-20 NOTE — Assessment & Plan Note (Addendum)
Check coumadin level today. Continue compression stockings - may take off at night, rec restart first thing in am.

## 2011-12-20 NOTE — Assessment & Plan Note (Signed)
Has completed home health PT.

## 2011-12-20 NOTE — Progress Notes (Signed)
  Subjective:    Patient ID: Margaret Haynes, female    DOB: [birth year], 78 y.o.   MRN: [id number]  HPI CC: 3 mo f/u  fall in tub yesterday.  Son took out slip resistant mat to clean and forgot to replace.  Did not injure herself.  Fell on bottom.  No other falls.  Uses walker consistently.  HTN - No HA, vision changes, CP/tightness, SOB, leg swelling.  ACEI caused dizziness so stopped.  Compliant with coreg. BP Readings from Last 3 Encounters:  12/20/11 124/90  09/13/11 140/90  08/12/11 182/100   DM - doesn't bring log.  Fasting sugar this morning 127.  Compliant with amaryl.  Last vision exam was 09/12/2011.  Foot exam today.   Lab Results  Component Value Date   HGBA1C 6.7* 08/12/2011    HLD - no myalgias, tolerating lovastating fine.  H/o DVT - on coumadin.  Due for recheck today. Lab Results  Component Value Date   INR 1.8 11/18/2011   INR 2.5 10/21/2011   INR 2.2* 09/26/2011    Past Medical History  Diagnosis Date  . Arthritis     in back, ?remote vertebral fracture  . History of chicken pox   . T2DM (type 2 diabetes mellitus)   . HTN (hypertension)   . HLD (hyperlipidemia)   . History of DVT (deep vein thrombosis) 2011, 2012    bilateral legs  . Urine incontinence 2011  . Hx: UTI (urinary tract infection)     recurrent  . Hearing impaired     bilateral hearing aides, last checked 06/2011  . Macular degeneration 2013    bilateral    Denies anhedonia, depression, sadness.  Review of Systems Per HPI    Objective:   Physical Exam  Nursing note and vitals reviewed. Constitutional: She appears well-developed and well-nourished. No distress.  HENT:  Head: Normocephalic and atraumatic.  Mouth/Throat: Oropharynx is clear and moist. No oropharyngeal exudate.  Eyes: Conjunctivae and EOM are normal. Pupils are equal, round, and reactive to light.  Neck: Normal range of motion. Neck supple.  Cardiovascular: Normal rate, regular rhythm and intact distal pulses.   Murmur  (2/6 SEM) heard. Pulmonary/Chest: Effort normal and breath sounds normal. No respiratory distress. She has no wheezes. She has no rales.  Musculoskeletal: She exhibits no edema.       Knee high compression stockings on Diabetic foot exam: Normal inspection No skin breakdown.  Dry skin throughout No calluses  Normal sensation to light touch and monofilament Nails thickened  Lymphadenopathy:    She has no cervical adenopathy.  Skin: Skin is warm and dry. No rash noted.       Assessment & Plan:

## 2011-12-20 NOTE — Patient Instructions (Signed)
Good to see you today.  No changes today.  Blood work today. Coumadin check today. Return in 5-6 months for follow up, sooner if needed. May take off compression stockings at night.

## 2011-12-20 NOTE — Assessment & Plan Note (Signed)
Chronic. Tolerating lovastatin well. Continue.

## 2011-12-22 ENCOUNTER — Ambulatory Visit (INDEPENDENT_AMBULATORY_CARE_PROVIDER_SITE_OTHER): Payer: Medicare Other | Admitting: Family Medicine

## 2011-12-22 DIAGNOSIS — Z7901 Long term (current) use of anticoagulants: Secondary | ICD-10-CM

## 2011-12-22 DIAGNOSIS — I82409 Acute embolism and thrombosis of unspecified deep veins of unspecified lower extremity: Secondary | ICD-10-CM

## 2011-12-22 DIAGNOSIS — Z5181 Encounter for therapeutic drug level monitoring: Secondary | ICD-10-CM

## 2011-12-22 DIAGNOSIS — Z86718 Personal history of other venous thrombosis and embolism: Secondary | ICD-10-CM

## 2011-12-22 LAB — POCT INR: INR: 1.6

## 2011-12-22 NOTE — Patient Instructions (Addendum)
Coumadin 6 mg daily, 3 mg Wed, recheck 2 weeks

## 2012-01-06 ENCOUNTER — Ambulatory Visit (INDEPENDENT_AMBULATORY_CARE_PROVIDER_SITE_OTHER): Payer: Medicare Other | Admitting: Family Medicine

## 2012-01-06 DIAGNOSIS — I82409 Acute embolism and thrombosis of unspecified deep veins of unspecified lower extremity: Secondary | ICD-10-CM

## 2012-01-06 DIAGNOSIS — Z86718 Personal history of other venous thrombosis and embolism: Secondary | ICD-10-CM

## 2012-01-06 DIAGNOSIS — Z5181 Encounter for therapeutic drug level monitoring: Secondary | ICD-10-CM

## 2012-01-06 DIAGNOSIS — Z7901 Long term (current) use of anticoagulants: Secondary | ICD-10-CM

## 2012-01-06 LAB — POCT INR: INR: 2.4

## 2012-01-06 NOTE — Patient Instructions (Signed)
Continue current dose, check in 4 weeks  

## 2012-01-13 ENCOUNTER — Ambulatory Visit (INDEPENDENT_AMBULATORY_CARE_PROVIDER_SITE_OTHER): Payer: Medicare Other | Admitting: Family Medicine

## 2012-01-13 ENCOUNTER — Encounter: Payer: Self-pay | Admitting: Family Medicine

## 2012-01-13 VITALS — BP 144/82 | HR 80 | Temp 97.9°F | Wt 153.5 lb

## 2012-01-13 DIAGNOSIS — F29 Unspecified psychosis not due to a substance or known physiological condition: Secondary | ICD-10-CM

## 2012-01-13 DIAGNOSIS — Z86718 Personal history of other venous thrombosis and embolism: Secondary | ICD-10-CM

## 2012-01-13 DIAGNOSIS — R410 Disorientation, unspecified: Secondary | ICD-10-CM

## 2012-01-13 DIAGNOSIS — R32 Unspecified urinary incontinence: Secondary | ICD-10-CM

## 2012-01-13 DIAGNOSIS — R296 Repeated falls: Secondary | ICD-10-CM

## 2012-01-13 DIAGNOSIS — Z9181 History of falling: Secondary | ICD-10-CM

## 2012-01-13 LAB — POCT URINALYSIS DIPSTICK
Glucose, UA: NEGATIVE
Ketones, UA: NEGATIVE
Protein, UA: 30
Spec Grav, UA: 1.01
Urobilinogen, UA: 0.2

## 2012-01-13 NOTE — Patient Instructions (Addendum)
Return for formal memory assessment in next few weeks to 1 month. Look into resources provided today, if you go [facility], bring me FL2 form to fill out. Good to see you today, call us with questions. Use walker at all times. Take break from compression stockings at night.

## 2012-01-13 NOTE — Progress Notes (Signed)
  Subjective:    Patient ID: Margaret Haynes, female    DOB: August 19, 1926, 78 y.o.   MRN: [id number]  HPI CC: increased confusion, falls, incontinence, r/o UTI  Increased falls recently, going on for last several months.  "legs give out".  No increased dizziness, lightheaded, presyncope, LOC.  Has not hurt herself falling.  Denies fevers/chills, chest pain, shortness of breath, coughing, abd pain, dysuria.  Denies back pain.  No hematuria.  Denies headache.  Also more confused recently, ie got dressed Saturday night but no church until next day.  Trouble with days of week.  Sometimes talking nonsense.  Some urinary incontinence as well as bowel incontinence - a few times a month.  No diarrhea/constipation or other bowel changes.  Last fall was 2d ago, fell in kitchen getting food ready in microwave, fell because legs got weak.  Trouble getting back up.  Stayed on floor for several hours until son able to come help her.  Does have life alert.  Usually uses walker, but did not at this time.  Family having some difficulty because she stays at home alone, family concerned about this.  Does her own showering, no assistance needed with transfers.  Increased falls, increased confusion, now trouble with bowel/bladder incontinence.  Completed physical therapy earlier this year.  Did not seem to help falls.  On chronic coumadin 2/2 h/o recurrent DVTs.  Wears compression stockings all day, even in bed despite being told to take break at night.    Past Medical History  Diagnosis Date  . Arthritis     in back, ?remote vertebral fracture  . History of chicken pox   . T2DM (type 2 diabetes mellitus)   . HTN (hypertension)   . HLD (hyperlipidemia)   . History of DVT (deep vein thrombosis) 2011, 2012    bilateral legs  . Urine incontinence 2011  . Hx: UTI (urinary tract infection)     recurrent  . Hearing impaired     bilateral hearing aides, last checked 06/2011  . Macular degeneration 2013   bilateral    Review of Systems Per HPI    Objective:   Physical Exam  Nursing note and vitals reviewed. Constitutional: She appears well-developed and well-nourished. No distress.       In wheelchair today  HENT:  Head: Normocephalic and atraumatic.  Mouth/Throat: Oropharynx is clear and moist. No oropharyngeal exudate.  Eyes: Conjunctivae and EOM are normal. Pupils are equal, round, and reactive to light. No scleral icterus.  Neck: Normal range of motion. Neck supple.  Cardiovascular: Normal rate, regular rhythm, normal heart sounds and intact distal pulses.   No murmur heard. Pulmonary/Chest: Effort normal and breath sounds normal. No respiratory distress. She has no wheezes.  Abdominal: Soft. Bowel sounds are normal. She exhibits no distension. There is no tenderness. There is no rebound and no guarding.  Musculoskeletal:       Compression stockings on  Lymphadenopathy:    She has no cervical adenopathy.  Neurological:       No significant weakness of BLE noted.  Skin: Skin is warm and dry. No rash noted.   Registration/recall 3/3 A&Ox3    Assessment & Plan:

## 2012-01-15 ENCOUNTER — Telehealth: Payer: Self-pay | Admitting: Family Medicine

## 2012-01-15 DIAGNOSIS — R296 Repeated falls: Secondary | ICD-10-CM

## 2012-01-15 DIAGNOSIS — R410 Disorientation, unspecified: Secondary | ICD-10-CM

## 2012-01-15 DIAGNOSIS — F0390 Unspecified dementia without behavioral disturbance: Secondary | ICD-10-CM | POA: Insufficient documentation

## 2012-01-15 NOTE — Assessment & Plan Note (Addendum)
Concern for developing dementia, possibly senile.  Along with increased incontinence, and longstanding imbalance. Check head CT to eval new dementia, r/o NPH, subdural, etc. Asked to return for formal memory assessment, MMSE, although on quick review no significant memory deficit (A&Ox3, 3/3 registration and recall). Will also ask to draw blood for B12 at next visit. Lab Results  Component Value Date   TSH 1.11 09/26/2011   No results found for this basename: [id number]   Daughter also brings up concern for increased level of care needed than what is provided at home.  Especially concerning is that patient stays at home alone for portion of day. Having trouble with increased bowel and bladder incontinence, but seems independent on all other ADLs, but dependent in IADLs Discussed starting by looking into home personal nursing aide, provided with [facility] pamphlet.  Briefly discussed placement process, encouraged her to check at local nursing homes for [facility] form.

## 2012-01-15 NOTE — Telephone Encounter (Signed)
plz notify family I'd like to order head CT given increased falls and confusion to r/o bleed as on coumadin, and to eval for confusion. Also see if they can come a few days prior to next appt for blood work to check for other causes of confusion. Also plz ask them to return iFOB that was provided 08/2011.

## 2012-01-15 NOTE — Assessment & Plan Note (Addendum)
"  legs get weak," not necessarily imbalance.  Anticipate multifactorial. Has undergone PT but pt/family doesn't think helped. Has walker but doesn't use.  Recommended regular use of walker.  Check B12 and D at next blood draw. Did discuss bleeding risk on coumadin with recurrent falls, may need to d/c this med altogether. See below.

## 2012-01-15 NOTE — Assessment & Plan Note (Signed)
Recent increase in incontinence episodes, check UA to r/o infection - trace blood, LE but micro negative for infection.

## 2012-01-15 NOTE — Assessment & Plan Note (Signed)
Ho bilateral DVTs in past.  On coumadin, goal INR 2-3 Given increasing falls, may need to either tighten range to 2-2.5 or d/c coumadin all together.

## 2012-01-16 NOTE — Telephone Encounter (Signed)
Message left for patient's daughter, Liborio Nixon. Advised to have her mom come in fasting when she come in for protime and we will draw additional labs. Advised to return iFOB or call me if they need another kit. Advised to expect call to schedule CT. Instructed to call me with any questions.

## 2012-01-18 ENCOUNTER — Ambulatory Visit: Payer: Self-pay | Admitting: Family Medicine

## 2012-01-18 ENCOUNTER — Other Ambulatory Visit: Payer: Medicare Other

## 2012-01-19 ENCOUNTER — Encounter: Payer: Self-pay | Admitting: Family Medicine

## 2012-01-20 ENCOUNTER — Ambulatory Visit: Payer: Medicare Other

## 2012-01-20 ENCOUNTER — Telehealth: Payer: Self-pay

## 2012-01-20 NOTE — Telephone Encounter (Signed)
Will have to forward to Dr. Reece Agar for his input. I can't expedite this. I spoke with patient's daughter and made her aware that she needs to find which facility she wants her mom to go to and go speak with their intake coordinator. I advised they will give her all the necessary paperwork and then we will be happy to complete it. I also advised that she may need to consider skilled nursing as well, since she is going to need help with meds, etc. It looks as though Child psychotherapist with [facility] made a referral for long term care in March. I contacted Geneice at [facility] to find out what happened with that. She is going to look through her file and get back to me. Delores is aware that it may be Monday before she hears back from me, but unfortunately it's just not a fast process. She verbalized understanding.

## 2012-01-20 NOTE — Telephone Encounter (Signed)
Delores left v/m; spoke with Margaret Haynes 01/19/12 about rescheduling memory test and pt possibly moving to assisted living. Pt fell again last night and Delores asked assisted living request be expedited. Request call back from kim.

## 2012-01-20 NOTE — Telephone Encounter (Signed)
Noted. Agree. Thanks.  If desires SNF/ALF, needs to pick up FL2 form for me to fill out.  I am seeing her 02/03/2012

## 2012-01-23 NOTE — Telephone Encounter (Signed)
Spoke with [place] and she said patient and family refused assistance in March because they felt it was unnecessary at that time. She discharged patient at that time as well. She did say she would make herself very available if anything was needed on her end. She said she would be available by phone if the family needed help with resources or locating vacancies.

## 2012-01-23 NOTE — Telephone Encounter (Signed)
Message left for Margaret Haynes to return my call and advise if she was able to locate any facilities that she liked. Advised that Margaret Haynes with [facility] made herself available if needed.

## 2012-01-23 NOTE — Telephone Encounter (Signed)
Spoke with Margaret Haynes and they are hoping to have her placed at [facility]. They dropped off paperwork on 01-20-12 for completion. Margaret Haynes called and left message that [facility] would have vacancy this week and said to fax FL2 to Caren Griffins at [facility] to help expedite things. He is waiting for FL2. Fax [phone]; Phone [phone].

## 2012-01-24 NOTE — Telephone Encounter (Signed)
Filled out FL2 and placed in Kim's box.

## 2012-01-24 NOTE — Telephone Encounter (Signed)
Delores notified and will follow up with Gala Romney at [facility].

## 2012-01-24 NOTE — Telephone Encounter (Signed)
FL2 faxed to [facility]. Message left for Delores to return my call.

## 2012-02-03 ENCOUNTER — Ambulatory Visit (INDEPENDENT_AMBULATORY_CARE_PROVIDER_SITE_OTHER): Payer: Medicare Other | Admitting: Family Medicine

## 2012-02-03 ENCOUNTER — Encounter: Payer: Self-pay | Admitting: Family Medicine

## 2012-02-03 VITALS — BP 164/96 | HR 74 | Temp 98.4°F | Resp 20 | Ht 63.5 in | Wt 168.0 lb

## 2012-02-03 DIAGNOSIS — Z9181 History of falling: Secondary | ICD-10-CM

## 2012-02-03 DIAGNOSIS — Z79899 Other long term (current) drug therapy: Secondary | ICD-10-CM

## 2012-02-03 DIAGNOSIS — I82409 Acute embolism and thrombosis of unspecified deep veins of unspecified lower extremity: Secondary | ICD-10-CM

## 2012-02-03 DIAGNOSIS — Z86718 Personal history of other venous thrombosis and embolism: Secondary | ICD-10-CM

## 2012-02-03 DIAGNOSIS — Z5181 Encounter for therapeutic drug level monitoring: Secondary | ICD-10-CM

## 2012-02-03 DIAGNOSIS — I10 Essential (primary) hypertension: Secondary | ICD-10-CM

## 2012-02-03 DIAGNOSIS — F03A0 Unspecified dementia, mild, without behavioral disturbance, psychotic disturbance, mood disturbance, and anxiety: Secondary | ICD-10-CM

## 2012-02-03 DIAGNOSIS — F0390 Unspecified dementia without behavioral disturbance: Secondary | ICD-10-CM

## 2012-02-03 DIAGNOSIS — R32 Unspecified urinary incontinence: Secondary | ICD-10-CM

## 2012-02-03 DIAGNOSIS — R410 Disorientation, unspecified: Secondary | ICD-10-CM

## 2012-02-03 DIAGNOSIS — Z7901 Long term (current) use of anticoagulants: Secondary | ICD-10-CM

## 2012-02-03 DIAGNOSIS — R296 Repeated falls: Secondary | ICD-10-CM

## 2012-02-03 DIAGNOSIS — F29 Unspecified psychosis not due to a substance or known physiological condition: Secondary | ICD-10-CM

## 2012-02-03 LAB — VITAMIN B12: Vitamin B-12: 255 pg/mL (ref 211–911)

## 2012-02-03 LAB — BASIC METABOLIC PANEL
BUN: 21 mg/dL (ref 6–23)
Chloride: 104 mEq/L (ref 96–112)
Creatinine, Ser: 0.9 mg/dL (ref 0.4–1.2)
GFR: 62.72 mL/min (ref >60.00)

## 2012-02-03 LAB — POCT INR: INR: 2.5

## 2012-02-03 MED ORDER — DONEPEZIL HCL 5 MG PO TABS: 5.0000 mg | ORAL_TABLET | Freq: Every evening | ORAL | Status: DC | PRN

## 2012-02-03 NOTE — Assessment & Plan Note (Signed)
Recently deteriorating.  Discussed possible worsening of dementia with anticholinergics, and will avoud myrtetriq 2/2 uncontrolled HTN

## 2012-02-03 NOTE — Patient Instructions (Signed)
Continue current dose, check in 4 weeks  

## 2012-02-03 NOTE — Progress Notes (Signed)
Subjective:    Patient ID: Margaret Haynes, female    DOB: [birth year], 78 y.o.   MRN: [id number]  HPI CC: geriatric assessment.  Presents today with walker.  Margaret Haynes presents today with her daughter for a geriatric assessment.  Recently worsening confusion, increased falls and increased urinary incontinence.  Workup including head CT negative for subdural or NPH (no acute process, chronic small vessel ischemic disease).  See prior visit for details.  Concern about her living at home alone given increase in falls and incontinence, were unable to set up home nursing aide, so have been looking at nursing homes for Margaret Haynes.  May take up to 6 wks to set this up, concerned about cost.  Currently living at home.  Sister's sister in law comes to stay with Margaret Haynes for several hours a day.    [facility] working with her.  Now working on getting into [facility] program.  Urinary incontinence - wears depends.  Several accidents per day.  Bowel accidents present as well.  Urge incontinence.  No significant stress sxs.  Lab Results  Component Value Date   INR 2.4 01/06/2012   INR 1.6 12/22/2011   INR 1.8 11/18/2011    Geriatric Assessment:  Activities of Daily Living:     Bathing- assisted (needs assistance to get in shower) has chair    Dressing- independent    Eating- independent    Toileting- independent     Transferring- independent (slowed)    Continence- dependent (accidents) Overall Assessment: assisted -> independent  Instrumental Activities of Daily Living:     Transportation- dependent (no driving in last several years)    Meal/Food Preparation- dependent (warms plates son makes for her)    Shopping Errands- dependent    Housekeeping/Chores- partially dependent     Money Management/Finances- dependent    Medication Management- dependent    Ability to Use Telephone- independent    Laundry- independent Overall Assessment: dependent  Mental Status Exam: 21/30(value/max value)     Clock  Drawing Score: 0/4 unable to perform     Medications and allergies reviewed and updated in chart.  Past histories reviewed and updated if relevant as below. Patient Active Problem List  Diagnosis  . Arthritis  . T2DM (type 2 diabetes mellitus)  . HTN (hypertension)  . HLD (hyperlipidemia)  . History of DVT (deep vein thrombosis)  . Urine incontinence  . Hearing impaired  . Recurrent falls  . Tinea pedis  . Cardiac murmur  . Confusion   Past Medical History  Diagnosis Date  . Arthritis     in back, ?remote vertebral fracture  . History of chicken pox   . T2DM (type 2 diabetes mellitus)   . HTN (hypertension)   . HLD (hyperlipidemia)   . History of DVT (deep vein thrombosis) 2011, 2012    bilateral legs  . Urine incontinence 2011  . Hx: UTI (urinary tract infection)     recurrent  . Hearing impaired     bilateral hearing aides, last checked 06/2011  . Macular degeneration 2013    bilateral   Past Surgical History  Procedure Date  . Tonsillectomy and adenoidectomy childhood  . Abdominal hysterectomy 1985    bleeding after menopause?  Marland Kitchen Ankle fracture surgery 2012   History  Substance Use Topics  . Smoking status: Former Smoker    Quit date: 06/14/1959  . Smokeless tobacco: Never Used  . Alcohol Use: No   Family History  Problem Relation Age of Onset  .  Heart disease Mother   . Coronary artery disease Mother   . Coronary artery disease Father   . Drug abuse Father   . Diabetes Father   . Diabetes Sister   . Coronary artery disease Sister   . Cancer Brother   . Stroke Neg Hx    Allergies  Allergen Reactions  . Ace Inhibitors Other (See Comments)    dizziness   Current Outpatient Prescriptions on File Prior to Visit  Medication Sig Dispense Refill  . acetaminophen (TYLENOL) 500 MG tablet Take 1,000 mg by mouth 2 (two) times daily as needed.      . Calcium Carbonate-Vit D-Min 600-400 MG-UNIT TABS Take 1 tablet by mouth 2 (two) times daily.      .  carvedilol (COREG) 6.25 MG tablet Take 1 tablet (6.25 mg total) by mouth 2 (two) times daily with a meal.  180 tablet  3  . cholecalciferol (VITAMIN D) 1000 UNITS tablet Take 1,000 Units by mouth daily.      Marland Kitchen glimepiride (AMARYL) 1 MG tablet Take 1 tablet (1 mg total) by mouth daily before breakfast.  90 tablet  3  . lovastatin (MEVACOR) 20 MG tablet Take 1 tablet (20 mg total) by mouth at bedtime.  90 tablet  3  . Omega-3 Fatty Acids (FISH OIL) 1200 MG CAPS Take 1 capsule by mouth 2 (two) times daily.      Marland Kitchen warfarin (COUMADIN) 6 MG tablet Take 1 tablet (6 mg total) by mouth daily. May take an extra tablet daily as needed  90 tablet  3    Review of Systems Per HPI    Objective:   Physical Exam  Nursing note and vitals reviewed. Constitutional: She appears well-developed and well-nourished. No distress.  HENT:  Head: Normocephalic and atraumatic.  Mouth/Throat: Oropharynx is clear and moist. No oropharyngeal exudate.  Eyes: Conjunctivae and EOM are normal. Pupils are equal, round, and reactive to light. No scleral icterus.  Neck: Normal range of motion. Neck supple.  Cardiovascular: Normal rate, regular rhythm, normal heart sounds and intact distal pulses.   No murmur heard. Pulmonary/Chest: Effort normal and breath sounds normal. No respiratory distress. She has no wheezes.  Abdominal: Soft. Bowel sounds are normal. She exhibits no distension. There is no tenderness. There is no rebound and no guarding.  Musculoskeletal:       Compression stockings on  Lymphadenopathy:    She has no cervical adenopathy.  Skin: Skin is warm and dry. No rash noted.       Assessment & Plan:

## 2012-02-03 NOTE — Assessment & Plan Note (Signed)
H/o bilat DVTs, recheck INR today - stable. Discussed with daughter possibility of stopping coumadin.  will continue for now.

## 2012-02-03 NOTE — Assessment & Plan Note (Signed)
bp elevated today BP Readings from Last 3 Encounters:  02/03/12 164/96  01/13/12 144/82  12/20/11 124/90  monitor for now.  No changes.

## 2012-02-03 NOTE — Assessment & Plan Note (Addendum)
MMSE today 21/30.  Mainly language deficits noted, with bowel/bladder incontinence. Mild to moderate dementia.  ?Alz vs senile. Discussed new diagnosis with daughter.   Discussed would benefit from PACE program.  Trial of aricept 5mg  daily for next 2 mo. Blood work today (B12)

## 2012-02-03 NOTE — Patient Instructions (Addendum)
Blood work today.  Coumadin check today (make sure we check INR today) I do think there is some memory trouble going on - mild dementia. Lets do trial of aricept - start at 5mg  daily.  Watch for stomach upset on this medicine. For incontinence, medicines to treat this can cause worsening memory problems. Return in 3 months for follow up.

## 2012-02-04 LAB — VITAMIN D 25 HYDROXY (VIT D DEFICIENCY, FRACTURES): Vit D, 25-Hydroxy: 39 ng/mL (ref 30–89)

## 2012-02-08 ENCOUNTER — Other Ambulatory Visit: Payer: Self-pay | Admitting: Family Medicine

## 2012-02-08 DIAGNOSIS — E538 Deficiency of other specified B group vitamins: Secondary | ICD-10-CM | POA: Insufficient documentation

## 2012-02-17 ENCOUNTER — Ambulatory Visit: Payer: Medicare Other | Admitting: Family Medicine

## 2012-02-29 ENCOUNTER — Encounter: Payer: Self-pay | Admitting: Family Medicine

## 2012-02-29 DIAGNOSIS — M949 Disorder of cartilage, unspecified: Secondary | ICD-10-CM | POA: Insufficient documentation

## 2012-02-29 DIAGNOSIS — M899 Disorder of bone, unspecified: Secondary | ICD-10-CM | POA: Insufficient documentation

## 2012-03-09 ENCOUNTER — Ambulatory Visit (INDEPENDENT_AMBULATORY_CARE_PROVIDER_SITE_OTHER): Payer: Medicare Other | Admitting: Family Medicine

## 2012-03-09 ENCOUNTER — Ambulatory Visit (INDEPENDENT_AMBULATORY_CARE_PROVIDER_SITE_OTHER): Payer: Medicare Other

## 2012-03-09 DIAGNOSIS — Z7901 Long term (current) use of anticoagulants: Secondary | ICD-10-CM

## 2012-03-09 DIAGNOSIS — Z23 Encounter for immunization: Secondary | ICD-10-CM

## 2012-03-09 DIAGNOSIS — E538 Deficiency of other specified B group vitamins: Secondary | ICD-10-CM

## 2012-03-09 DIAGNOSIS — I82409 Acute embolism and thrombosis of unspecified deep veins of unspecified lower extremity: Secondary | ICD-10-CM

## 2012-03-09 DIAGNOSIS — Z5181 Encounter for therapeutic drug level monitoring: Secondary | ICD-10-CM

## 2012-03-09 DIAGNOSIS — Z86718 Personal history of other venous thrombosis and embolism: Secondary | ICD-10-CM

## 2012-03-09 LAB — POCT INR: INR: 2.7

## 2012-03-09 MED ORDER — CYANOCOBALAMIN 1000 MCG/ML IJ SOLN
1000.0000 ug | Freq: Once | INTRAMUSCULAR | Status: AC
  Administered 2012-03-09: 1000 ug via INTRAMUSCULAR

## 2012-03-09 NOTE — Patient Instructions (Signed)
Continue current dose, check in 4 weeks  

## 2012-04-06 ENCOUNTER — Ambulatory Visit (INDEPENDENT_AMBULATORY_CARE_PROVIDER_SITE_OTHER): Payer: Medicare Other | Admitting: Family Medicine

## 2012-04-06 ENCOUNTER — Ambulatory Visit (INDEPENDENT_AMBULATORY_CARE_PROVIDER_SITE_OTHER): Payer: Medicare Other | Admitting: *Deleted

## 2012-04-06 DIAGNOSIS — Z5181 Encounter for therapeutic drug level monitoring: Secondary | ICD-10-CM

## 2012-04-06 DIAGNOSIS — Z7901 Long term (current) use of anticoagulants: Secondary | ICD-10-CM

## 2012-04-06 DIAGNOSIS — I82409 Acute embolism and thrombosis of unspecified deep veins of unspecified lower extremity: Secondary | ICD-10-CM

## 2012-04-06 DIAGNOSIS — E538 Deficiency of other specified B group vitamins: Secondary | ICD-10-CM

## 2012-04-06 DIAGNOSIS — Z86718 Personal history of other venous thrombosis and embolism: Secondary | ICD-10-CM

## 2012-04-06 MED ORDER — CYANOCOBALAMIN 1000 MCG/ML IJ SOLN
1000.0000 ug | Freq: Once | INTRAMUSCULAR | Status: AC
  Administered 2012-04-06: 1000 ug via INTRAMUSCULAR

## 2012-04-06 NOTE — Patient Instructions (Signed)
Coumadin 6 mg daily, 3 mg Wed,FRI. Decreased 3 mg weekly today, will re check in 2 weeks

## 2012-04-19 ENCOUNTER — Ambulatory Visit (INDEPENDENT_AMBULATORY_CARE_PROVIDER_SITE_OTHER): Payer: Medicare Other | Admitting: Family Medicine

## 2012-04-19 DIAGNOSIS — I82409 Acute embolism and thrombosis of unspecified deep veins of unspecified lower extremity: Secondary | ICD-10-CM

## 2012-04-19 DIAGNOSIS — Z86718 Personal history of other venous thrombosis and embolism: Secondary | ICD-10-CM

## 2012-04-19 DIAGNOSIS — Z5181 Encounter for therapeutic drug level monitoring: Secondary | ICD-10-CM

## 2012-04-19 DIAGNOSIS — Z7901 Long term (current) use of anticoagulants: Secondary | ICD-10-CM

## 2012-04-19 NOTE — Patient Instructions (Signed)
Continue 6 mg daily, 3 mg Wed,Sat. Recheck 4 weeks

## 2012-05-04 ENCOUNTER — Ambulatory Visit: Payer: Medicare Other | Admitting: Family Medicine

## 2012-05-04 DIAGNOSIS — Z0289 Encounter for other administrative examinations: Secondary | ICD-10-CM

## 2012-05-17 ENCOUNTER — Ambulatory Visit: Payer: Medicare Other

## 2012-05-17 ENCOUNTER — Ambulatory Visit (INDEPENDENT_AMBULATORY_CARE_PROVIDER_SITE_OTHER): Payer: Medicare Other | Admitting: General Practice

## 2012-05-17 DIAGNOSIS — Z86718 Personal history of other venous thrombosis and embolism: Secondary | ICD-10-CM

## 2012-05-17 LAB — POCT INR: INR: 3.5

## 2012-05-17 MED ORDER — CYANOCOBALAMIN 1000 MCG/ML IJ SOLN
1000.0000 ug | Freq: Once | INTRAMUSCULAR | Status: AC
  Administered 2012-05-17: 1000 ug via INTRAMUSCULAR

## 2012-05-18 ENCOUNTER — Ambulatory Visit: Payer: Medicare Other

## 2012-05-31 ENCOUNTER — Ambulatory Visit: Payer: Medicare Other

## 2012-05-31 ENCOUNTER — Ambulatory Visit (INDEPENDENT_AMBULATORY_CARE_PROVIDER_SITE_OTHER): Payer: Medicare Other | Admitting: General Practice

## 2012-05-31 DIAGNOSIS — Z86718 Personal history of other venous thrombosis and embolism: Secondary | ICD-10-CM

## 2012-05-31 LAB — POCT INR: INR: 2.4

## 2012-06-10 ENCOUNTER — Other Ambulatory Visit: Payer: Self-pay | Admitting: Family Medicine

## 2012-06-22 ENCOUNTER — Ambulatory Visit (INDEPENDENT_AMBULATORY_CARE_PROVIDER_SITE_OTHER): Payer: Medicare Other | Admitting: Family Medicine

## 2012-06-22 ENCOUNTER — Encounter: Payer: Self-pay | Admitting: Family Medicine

## 2012-06-22 ENCOUNTER — Other Ambulatory Visit: Payer: Self-pay | Admitting: *Deleted

## 2012-06-22 VITALS — BP 146/90 | HR 68 | Temp 97.7°F | Wt 171.2 lb

## 2012-06-22 DIAGNOSIS — F0390 Unspecified dementia without behavioral disturbance: Secondary | ICD-10-CM

## 2012-06-22 DIAGNOSIS — L602 Onychogryphosis: Secondary | ICD-10-CM | POA: Insufficient documentation

## 2012-06-22 DIAGNOSIS — Z7901 Long term (current) use of anticoagulants: Secondary | ICD-10-CM

## 2012-06-22 DIAGNOSIS — I10 Essential (primary) hypertension: Secondary | ICD-10-CM

## 2012-06-22 DIAGNOSIS — Z5181 Encounter for therapeutic drug level monitoring: Secondary | ICD-10-CM

## 2012-06-22 DIAGNOSIS — I82409 Acute embolism and thrombosis of unspecified deep veins of unspecified lower extremity: Secondary | ICD-10-CM

## 2012-06-22 DIAGNOSIS — E119 Type 2 diabetes mellitus without complications: Secondary | ICD-10-CM

## 2012-06-22 DIAGNOSIS — Z86718 Personal history of other venous thrombosis and embolism: Secondary | ICD-10-CM

## 2012-06-22 DIAGNOSIS — L608 Other nail disorders: Secondary | ICD-10-CM

## 2012-06-22 DIAGNOSIS — E785 Hyperlipidemia, unspecified: Secondary | ICD-10-CM

## 2012-06-22 LAB — BASIC METABOLIC PANEL
Calcium: 9.4 mg/dL (ref 8.4–10.5)
GFR: 58.91 mL/min — ABNORMAL LOW (ref >60.00)
Potassium: 4.4 mEq/L (ref 3.5–5.1)
Sodium: 141 mEq/L (ref 135–145)

## 2012-06-22 LAB — MICROALBUMIN / CREATININE URINE RATIO
Creatinine,U: 75.6 mg/dL
Microalb, Ur: 21.5 mg/dL — ABNORMAL HIGH (ref 0.0–1.9)

## 2012-06-22 MED ORDER — GLUCOSE BLOOD VI STRP: ORAL_STRIP | Status: AC

## 2012-06-22 MED ORDER — GLUCOSE BLOOD VI STRP: ORAL_STRIP | Status: DC

## 2012-06-22 NOTE — Assessment & Plan Note (Signed)
With onychomycosis.  Daughter requests referral to podiatry today, given h/o dm agree.

## 2012-06-22 NOTE — Progress Notes (Signed)
  Subjective:    Patient ID: Margaret Haynes, female    DOB: 11-Dec-1926, 78 y.o.   MRN: [id number]  HPI CC: 6 mo f/u  Margaret Haynes returns today for 6 month follow up.  HTN - compliant with meds.  No HA, vision changes, CP/tightness, SOB, leg swelling.   Diabetes - doesn't check sugars regularly because ran out.  Would like prescription for strips.   Lab Results  Component Value Date   HGBA1C 7.0* 12/20/2011   HLD - compliant with lovastatin.  No myalgias.  Dementia - tolerating aricept well.  No nausea.  Still gets confused some esp when talking on phone.    Toenails - would like evaluated.  Stable for years.  Past Medical History  Diagnosis Date  . Arthritis     in back, ?remote vertebral fracture  . History of chicken pox   . T2DM (type 2 diabetes mellitus)   . HTN (hypertension)   . HLD (hyperlipidemia)   . History of DVT (deep vein thrombosis) 2011, 2012    bilateral legs  . Urine incontinence 2011  . Hx: UTI (urinary tract infection)     recurrent  . Hearing impaired     bilateral hearing aides, last checked 06/2011  . Macular degeneration 2013    bilateral    Review of Systems Per HPI    Objective:   Physical Exam  Nursing note and vitals reviewed. Constitutional: She appears well-developed and well-nourished. No distress.  HENT:  Head: Normocephalic and atraumatic.  Right Ear: External ear normal.  Left Ear: External ear normal.  Nose: Nose normal.  Mouth/Throat: Oropharynx is clear and moist. No oropharyngeal exudate.  Eyes: Conjunctivae normal and EOM are normal. Pupils are equal, round, and reactive to light. No scleral icterus.  Neck: Normal range of motion. Neck supple.  Cardiovascular: Normal rate, regular rhythm, normal heart sounds and intact distal pulses.   No murmur heard. Pulmonary/Chest: Effort normal and breath sounds normal. No respiratory distress. She has no wheezes. She has no rales.  Musculoskeletal: She exhibits no edema.       Diabetic foot  exam: Normal inspection No skin breakdown Calluses present esp R bunion Mildly diminished DP/PT pulses Normal sensation to light touch and monofilament Nails onychomycotic, thickened and elongated Hallux deformity R>L  Skin: Skin is warm and dry. No rash noted.  Psychiatric: She has a normal mood and affect.       Assessment & Plan:

## 2012-06-22 NOTE — Addendum Note (Signed)
Addended by: Baldomero Lamy on: 06/22/2012 10:11 AM   Modules accepted: Orders

## 2012-06-22 NOTE — Assessment & Plan Note (Signed)
Stable.  No deterioration recently. Unsure if aricept has affected change.  No changes for today.

## 2012-06-22 NOTE — Assessment & Plan Note (Signed)
Chronic, stable, tolerating med well.

## 2012-06-22 NOTE — Assessment & Plan Note (Signed)
Chronic, stable. Adequate control, slightly elevated for h/o DM, but no changes today.

## 2012-06-22 NOTE — Assessment & Plan Note (Signed)
Daughter requests INR check today as here at office. Check INR today, if normal f/u with [facility].

## 2012-06-22 NOTE — Assessment & Plan Note (Signed)
Chronic, stable. refilled test strips,. rec check QOD. Blood work today.

## 2012-06-22 NOTE — Patient Instructions (Addendum)
Cancel appt up front for coumadin check and reschedule in 1 month Blood work check today including coumadin. Pass by Marion's office for referral to podiatry for toenails.

## 2012-06-25 ENCOUNTER — Encounter: Payer: Self-pay | Admitting: *Deleted

## 2012-06-28 ENCOUNTER — Ambulatory Visit: Payer: Medicare Other

## 2012-07-26 ENCOUNTER — Ambulatory Visit: Payer: Medicare Other

## 2012-07-30 ENCOUNTER — Ambulatory Visit (INDEPENDENT_AMBULATORY_CARE_PROVIDER_SITE_OTHER): Payer: Medicare Other | Admitting: General Practice

## 2012-07-30 DIAGNOSIS — Z86718 Personal history of other venous thrombosis and embolism: Secondary | ICD-10-CM

## 2012-08-27 ENCOUNTER — Ambulatory Visit: Payer: Medicare Other | Admitting: Family Medicine

## 2012-08-27 ENCOUNTER — Ambulatory Visit: Payer: Medicare Other

## 2012-08-30 ENCOUNTER — Ambulatory Visit (INDEPENDENT_AMBULATORY_CARE_PROVIDER_SITE_OTHER): Payer: Medicare Other | Admitting: General Practice

## 2012-08-30 ENCOUNTER — Ambulatory Visit (INDEPENDENT_AMBULATORY_CARE_PROVIDER_SITE_OTHER): Payer: Medicare Other | Admitting: Family Medicine

## 2012-08-30 ENCOUNTER — Encounter: Payer: Self-pay | Admitting: Family Medicine

## 2012-08-30 VITALS — BP 132/68 | HR 75 | Temp 97.9°F

## 2012-08-30 DIAGNOSIS — Z5181 Encounter for therapeutic drug level monitoring: Secondary | ICD-10-CM

## 2012-08-30 DIAGNOSIS — Z7901 Long term (current) use of anticoagulants: Secondary | ICD-10-CM

## 2012-08-30 DIAGNOSIS — Z86718 Personal history of other venous thrombosis and embolism: Secondary | ICD-10-CM

## 2012-08-30 DIAGNOSIS — F0390 Unspecified dementia without behavioral disturbance: Secondary | ICD-10-CM

## 2012-08-30 DIAGNOSIS — I82409 Acute embolism and thrombosis of unspecified deep veins of unspecified lower extremity: Secondary | ICD-10-CM

## 2012-08-30 MED ORDER — MEMANTINE HCL ER 7 MG PO CP24: 7.0000 mg | ORAL_CAPSULE | Freq: Every day | ORAL | Status: DC

## 2012-08-30 NOTE — Patient Instructions (Addendum)
This sounds like alzheimer's. Let's start medicine called namenda at 7mg  once daily for 1 week then may increase to two pills daily. Give me an update in 1 month (call me or come in to see me) to see how we're doing. I do think you would benefit from more help at home - be it hiring a nurses aid or otherwise. If memory continues to get worse or any sudden changes, let me know for further imaging.  Dementia Dementia is a general term for problems with brain function. A person with dementia has memory loss and a hard time with at least one other brain function such as thinking, speaking, or problem solving. Dementia can affect social functioning, how you do your job, your mood, or your personality. The changes may be hidden for a long time. The earliest forms of this disease are usually not detected by family or friends. Dementia can be:  Irreversible.  Potentially reversible.  Partially reversible.  Progressive. This means it can get worse over time. CAUSES  Irreversible dementia causes may include:  Degeneration of brain cells (Alzheimer's disease or lewy body dementia).  Multiple small strokes (vascular dementia).  Infection (chronic meningitis or Creutzfelt-Jakob disease).  Frontotemporal dementia. This affects younger people, age 45 to 30, compared to those who have Alzheimer's disease.  Dementia associated with other disorders like Parkinson's disease, Huntington's disease, or HIV-associated dementia. Potentially or partially reversible dementia causes may include:  Medicines.  Metabolic causes such as excessive alcohol intake, vitamin B12 deficiency, or thyroid disease.  Masses or pressure in the brain such as a tumor, blood clot, or hydrocephalus. SYMPTOMS  Symptoms are often hard to detect. Family members or coworkers may not notice them early in the disease process. Different people with dementia may have different symptoms. Symptoms can include:  A hard time with  memory, especially recent memory. Long-term memory may not be impaired.  Asking the same question multiple times or forgetting something someone just said.  A hard time speaking your thoughts or finding certain words.  A hard time solving problems or performing familiar tasks (such as how to use a telephone).  Sudden changes in mood.  Changes in personality, especially increasing moodiness or mistrust.  Depression.  A hard time understanding complex ideas that were never a problem in the past. DIAGNOSIS  There are no specific tests for dementia.   Your caregiver may recommend a thorough evaluation. This is because some forms of dementia can be reversible. The evaluation will likely include a physical exam and getting a detailed history from you and a family member. The history often gives the best clues and suggestions for a diagnosis.  Memory testing may be done. A detailed brain function evaluation called neuropsychologic testing may be helpful.  Lab tests and brain imaging (such as a CT scan or MRI scan) are sometimes important.  Sometimes observation and re-evaluation over time is very helpful. TREATMENT  Treatment depends on the cause.   If the problem is a vitamin deficiency, it may be helped or cured with supplements.  For dementias such as Alzheimer's disease, medicines are available to stabilize or slow the course of the disease. There are no cures for this type of dementia.  Your caregiver can help direct you to groups, organizations, and other caregivers to help with decisions in the care of you or your loved one. HOME CARE INSTRUCTIONS The care of individuals with dementia is varied and dependent upon the progression of the dementia. The following suggestions  are intended for the person living with, or caring for, the person with dementia.  Create a safe environment.  Remove the locks on bathroom doors to prevent the person from accidentally locking himself or  herself in.  Use childproof latches on kitchen cabinets and any place where cleaning supplies, chemicals, or alcohol are kept.  Use childproof covers in unused electrical outlets.  Install childproof devices to keep doors and windows secured.  Remove stove knobs or install safety knobs and an automatic shut-off on the stove.  Lower the temperature on water heaters.  Label medicines and keep them locked up.  Secure knives, lighters, matches, power tools, and guns, and keep these items out of reach.  Keep the house free from clutter. Remove rugs or anything that might contribute to a fall.  Remove objects that might break and hurt the person.  Make sure lighting is good, both inside and outside.  Install grab rails as needed.  Use a monitoring device to alert you to falls or other needs for help.  Reduce confusion.  Keep familiar objects and people around.  Use night lights or dim lights at night.  Label items or areas.  Use reminders, notes, or directions for daily activities or tasks.  Keep a simple, consistent routine for waking, meals, bathing, dressing, and bedtime.  Create a calm, quiet environment.  Place large clocks and calendars prominently.  Display emergency numbers and home address near all telephones.  Use cues to establish different times of the day. An example is to open curtains to let the natural light in during the day.   Use effective communication.  Choose simple words and short sentences.  Use a gentle, calm tone of voice.  Be careful not to interrupt.  If the person is struggling to find a word or communicate a thought, try to provide the word or thought.  Ask one question at a time. Allow the person ample time to answer questions. Repeat the question again if the person does not respond.  Reduce nighttime restlessness.  Provide a comfortable bed.  Have a consistent nighttime routine.  Ensure a regular walking or physical activity  schedule. Involve the person in daily activities as much as possible.  Limit napping during the day.  Limit caffeine.  Attend social events that stimulate rather than overwhelm the senses.  Encourage good nutrition and hydration.  Reduce distractions during meal times and snacks.  Avoid foods that are too hot or too cold.  Monitor chewing and swallowing ability.  Continue with routine vision, hearing, dental, and medical screenings.  Only give over-the-counter or prescription medicines as directed by the caregiver.  Monitor driving abilities. Do not allow the person to drive when safe driving is no longer possible.  Register with an identification program which could provide location assistance in the event of a missing person situation. SEEK MEDICAL CARE IF:   New behavioral problems start such as moodiness, aggressiveness, or seeing things that are not there (hallucinations).  Any new problem with brain function happens. This includes problems with balance, speech, or falling a lot.  Problems with swallowing develop.  Any symptoms of other illness happen. Small changes or worsening in any aspect of brain function can be a sign that the illness is getting worse. It can also be a sign of another medical illness such as infection. Seeing a caregiver right away is important. SEEK IMMEDIATE MEDICAL CARE IF:   A fever develops.  New or worsened confusion develops.  New  or worsened sleepiness develops.  Staying awake becomes hard to do. Document Released: 11/23/2000 Document Revised: 08/22/2011 Document Reviewed: 10/25/2010 [facility] Patient Information 2013 [place].

## 2012-08-30 NOTE — Assessment & Plan Note (Signed)
Head CT 01/2012 with chronic small vessel ischemic disease. Anticipate alzheimer's type, vs possible vascular dementia. Discussed anticipated AD dx.  Continue aricept 5mg , start namenda XR 7mg  daily for 1 wk then increase to 14mg  daily. If persistent progression or no improvement, obtain brain MRI to further evaluation for vascular dementia. Continue coumadin for h/o DVTs.  HTN, HLD well controlled. Discussed need for further assistance at home vs nurse aid (daughter able to set this up) - although pt hesitant to allow anyone other than daughters into her home.  Pt desires to stay at home as long as able.
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
## 2012-09-16 ENCOUNTER — Other Ambulatory Visit: Payer: Self-pay | Admitting: Family Medicine

## 2012-09-27 ENCOUNTER — Ambulatory Visit: Payer: Medicare Other

## 2012-09-27 ENCOUNTER — Ambulatory Visit (INDEPENDENT_AMBULATORY_CARE_PROVIDER_SITE_OTHER): Payer: Medicare Other | Admitting: General Practice

## 2012-09-27 ENCOUNTER — Telehealth: Payer: Self-pay | Admitting: Family Medicine

## 2012-09-27 DIAGNOSIS — E538 Deficiency of other specified B group vitamins: Secondary | ICD-10-CM

## 2012-09-27 DIAGNOSIS — Z5181 Encounter for therapeutic drug level monitoring: Secondary | ICD-10-CM

## 2012-09-27 DIAGNOSIS — Z86718 Personal history of other venous thrombosis and embolism: Secondary | ICD-10-CM

## 2012-09-27 DIAGNOSIS — Z7901 Long term (current) use of anticoagulants: Secondary | ICD-10-CM

## 2012-09-27 DIAGNOSIS — I82409 Acute embolism and thrombosis of unspecified deep veins of unspecified lower extremity: Secondary | ICD-10-CM

## 2012-09-27 NOTE — Telephone Encounter (Signed)
Pt came into office for coumadin check today and inquired about her B-12 inj. She hasn't had one since Dec, 2013.  Do you want her to take it orally? She says you can call her daughter, Andrey Cota w/the info. Thank you.

## 2012-09-28 NOTE — Telephone Encounter (Signed)
Lab Results  Component Value Date   VITAMINB12 255 02/03/2012  i'd like her to come in for vit B12 measure, then have her start b12 daily.

## 2012-10-01 ENCOUNTER — Encounter: Payer: Self-pay | Admitting: *Deleted

## 2012-10-01 NOTE — Telephone Encounter (Signed)
Message left advising patient's daughter. Advised that lab could be drawn at next coumadin check or sooner if she preferred. Advised to call and schedule sooner lab if she wanted otherwise would leave her schedule for 10/25/12. Lab appt scheduled.

## 2012-10-05 ENCOUNTER — Telehealth: Payer: Self-pay

## 2012-10-05 ENCOUNTER — Other Ambulatory Visit: Payer: Self-pay | Admitting: *Deleted

## 2012-10-05 MED ORDER — MEMANTINE HCL ER 7 MG PO CP24: 7.0000 mg | ORAL_CAPSULE | Freq: Every day | ORAL | Status: AC

## 2012-10-05 NOTE — Telephone Encounter (Signed)
Dawn with [facility] called back for clarification on Namenda. Notified as instructed.

## 2012-10-05 NOTE — Telephone Encounter (Signed)
Yes, it should be 7 mg once daily x 1 week then increase to two pills daily.  If she has been on it already for >1 week, then continue with two pills daily.   Thanks.

## 2012-10-05 NOTE — Telephone Encounter (Signed)
[facility] [address] left v/m to clarify Namenda XR directions; directions read take 7 mg daily; followed by take 2 tabs by mouth daily. In 08/30/12 office note appears 7 mg once daily x 1 week then increase to two pills daily.Please advise.

## 2012-10-11 ENCOUNTER — Ambulatory Visit: Payer: Self-pay | Admitting: Internal Medicine

## 2012-10-25 ENCOUNTER — Other Ambulatory Visit: Payer: Medicare Other

## 2012-10-25 ENCOUNTER — Ambulatory Visit: Payer: Medicare Other

## 2012-10-29 ENCOUNTER — Ambulatory Visit: Payer: Medicare Other

## 2012-11-07 ENCOUNTER — Emergency Department: Payer: Self-pay | Admitting: Emergency Medicine

## 2012-11-07 LAB — URINALYSIS, COMPLETE
Bilirubin,UR: NEGATIVE
Glucose,UR: NEGATIVE mg/dL (ref 0–75)
Ketone: NEGATIVE
Ph: 6 (ref 4.5–8.0)
RBC,UR: 1 /HPF (ref 0–5)

## 2012-11-07 LAB — CBC
HCT: 37.9 % (ref 35.0–47.0)
MCH: 25.9 pg — ABNORMAL LOW (ref 26.0–34.0)
MCHC: 32.8 g/dL (ref 32.0–36.0)
MCV: 79 fL — ABNORMAL LOW (ref 80–100)
WBC: 9.6 10*3/uL (ref 3.6–11.0)

## 2012-11-07 LAB — TROPONIN I: Troponin-I: <0.02 ng/mL

## 2012-11-08 ENCOUNTER — Telehealth: Payer: Self-pay | Admitting: Family Medicine

## 2012-11-08 ENCOUNTER — Ambulatory Visit: Payer: Medicare Other

## 2012-11-08 ENCOUNTER — Ambulatory Visit: Payer: Medicare Other | Admitting: Family Medicine

## 2012-11-08 ENCOUNTER — Observation Stay: Payer: Self-pay | Admitting: Internal Medicine

## 2012-11-08 LAB — COMPREHENSIVE METABOLIC PANEL
Calcium, Total: 9.6 mg/dL (ref 8.5–10.1)
Chloride: 103 mmol/L (ref 98–107)
Co2: 29 mmol/L (ref 21–32)
EGFR (African American): 49 — ABNORMAL LOW
EGFR (Non-African Amer.): 42 — ABNORMAL LOW
Glucose: 123 mg/dL — ABNORMAL HIGH (ref 65–99)
Osmolality: 280 (ref 275–301)
Potassium: 3.6 mmol/L (ref 3.5–5.1)
SGOT(AST): 16 U/L (ref 15–37)
SGPT (ALT): 18 U/L (ref 12–78)
Sodium: 138 mmol/L (ref 136–145)
Total Protein: 8.2 g/dL (ref 6.4–8.2)

## 2012-11-08 LAB — PROTIME-INR: Prothrombin Time: 22.5 secs — ABNORMAL HIGH (ref 11.5–14.7)

## 2012-11-08 NOTE — Telephone Encounter (Signed)
Message left for patient's daughter to return my call.

## 2012-11-08 NOTE — Telephone Encounter (Signed)
Noted. Can we call for an update later today?
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
## 2012-11-09 DIAGNOSIS — I359 Nonrheumatic aortic valve disorder, unspecified: Secondary | ICD-10-CM

## 2012-11-09 LAB — URINE CULTURE

## 2012-11-09 LAB — LIPID PANEL
Cholesterol: 235 mg/dL — ABNORMAL HIGH (ref 0–200)
Ldl Cholesterol, Calc: 165 mg/dL — ABNORMAL HIGH (ref 0–100)
Triglycerides: 103 mg/dL (ref 0–200)
VLDL Cholesterol, Calc: 21 mg/dL (ref 5–40)

## 2012-11-09 LAB — BASIC METABOLIC PANEL
BUN: 17 mg/dL (ref 7–18)
Calcium, Total: 8.4 mg/dL — ABNORMAL LOW (ref 8.5–10.1)
Chloride: 108 mmol/L — ABNORMAL HIGH (ref 98–107)
Co2: 27 mmol/L (ref 21–32)
EGFR (Non-African Amer.): 57 — ABNORMAL LOW
Glucose: 111 mg/dL — ABNORMAL HIGH (ref 65–99)
Osmolality: 283 (ref 275–301)
Potassium: 3.2 mmol/L — ABNORMAL LOW (ref 3.5–5.1)
Sodium: 141 mmol/L (ref 136–145)

## 2012-11-09 LAB — CBC WITH DIFFERENTIAL/PLATELET
Eosinophil #: 0.1 10*3/uL (ref 0.0–0.7)
Eosinophil %: 2.4 %
HCT: 33.8 % — ABNORMAL LOW (ref 35.0–47.0)
HGB: 11.2 g/dL — ABNORMAL LOW (ref 12.0–16.0)
Lymphocyte #: 1.5 10*3/uL (ref 1.0–3.6)
Lymphocyte %: 25.4 %
MCHC: 33 g/dL (ref 32.0–36.0)
MCV: 79 fL — ABNORMAL LOW (ref 80–100)
Monocyte %: 16 %
Neutrophil #: 3.4 10*3/uL (ref 1.4–6.5)
Neutrophil %: 55.4 %
RBC: 4.27 10*6/uL (ref 3.80–5.20)
RDW: 17.9 % — ABNORMAL HIGH (ref 11.5–14.5)
WBC: 6.1 10*3/uL (ref 3.6–11.0)

## 2012-11-09 LAB — TSH: Thyroid Stimulating Horm: 1.85 u[IU]/mL

## 2012-11-11 ENCOUNTER — Ambulatory Visit: Payer: Self-pay | Admitting: Internal Medicine

## 2012-11-16 ENCOUNTER — Other Ambulatory Visit: Payer: Self-pay | Admitting: Family Medicine

## 2012-11-16 LAB — URINALYSIS, COMPLETE
Blood: NEGATIVE
Ketone: NEGATIVE
Ph: 6 (ref 4.5–8.0)
RBC,UR: 5 /HPF (ref 0–5)
WBC UR: 37 /HPF (ref 0–5)

## 2012-11-17 LAB — URINE CULTURE

## 2012-11-27 NOTE — Telephone Encounter (Signed)
Enc opened in error

## 2012-12-11 ENCOUNTER — Ambulatory Visit: Payer: Self-pay | Admitting: Internal Medicine

## 2012-12-18 LAB — HM DIABETES EYE EXAM

## 2012-12-20 ENCOUNTER — Other Ambulatory Visit: Payer: Self-pay

## 2012-12-21 ENCOUNTER — Encounter: Payer: Medicare Other | Admitting: Family Medicine

## 2012-12-21 DIAGNOSIS — Z0289 Encounter for other administrative examinations: Secondary | ICD-10-CM

## 2012-12-24 ENCOUNTER — Encounter: Payer: Self-pay | Admitting: Family Medicine

## 2013-01-18 ENCOUNTER — Other Ambulatory Visit: Payer: Self-pay | Admitting: Family Medicine

## 2013-01-18 LAB — CBC WITH DIFFERENTIAL/PLATELET
Basophil #: 0.1 10*3/uL (ref 0.0–0.1)
Basophil %: 0.9 %
HGB: 12.2 g/dL (ref 12.0–16.0)
Lymphocyte #: 1.5 10*3/uL (ref 1.0–3.6)
MCH: 25.7 pg — ABNORMAL LOW (ref 26.0–34.0)
MCHC: 32.2 g/dL (ref 32.0–36.0)
Monocyte #: 0.6 x10 3/mm (ref 0.2–0.9)
RBC: 4.74 10*6/uL (ref 3.80–5.20)
RDW: 16.8 % — ABNORMAL HIGH (ref 11.5–14.5)

## 2013-01-18 LAB — URINALYSIS, COMPLETE
Bilirubin,UR: NEGATIVE
Blood: NEGATIVE
Glucose,UR: NEGATIVE mg/dL (ref 0–75)
Ketone: NEGATIVE
Nitrite: NEGATIVE
Ph: 6 (ref 4.5–8.0)
Protein: 100
RBC,UR: 2 /HPF (ref 0–5)
Squamous Epithelial: <1
WBC UR: <1 /HPF (ref 0–5)

## 2013-01-18 LAB — COMPREHENSIVE METABOLIC PANEL
Albumin: 3.6 g/dL (ref 3.4–5.0)
BUN: 23 mg/dL — ABNORMAL HIGH (ref 7–18)
Bilirubin,Total: 0.2 mg/dL (ref 0.2–1.0)
Creatinine: 0.86 mg/dL (ref 0.60–1.30)
EGFR (African American): >60
EGFR (Non-African Amer.): >60
Osmolality: 287 (ref 275–301)
Potassium: 3.9 mmol/L (ref 3.5–5.1)
SGOT(AST): 39 U/L — ABNORMAL HIGH (ref 15–37)
Sodium: 140 mmol/L (ref 136–145)
Total Protein: 7.3 g/dL (ref 6.4–8.2)

## 2013-01-18 LAB — PROTIME-INR
INR: 2.9
Prothrombin Time: 29.2 secs — ABNORMAL HIGH (ref 11.5–14.7)

## 2013-01-23 IMAGING — CT CT HEAD WITHOUT CONTRAST
1 series · 15 of 30 positions shown, 19 images · non-contrast
Comparison: none

REASON FOR EXAM: confusion recurrent falls urine incontinence
COMMENTS:

PROCEDURE:     KCT - KCT HEAD WITHOUT CONTRAST  - January 18, 2012  [DATE]
RESULT:     Comparison:  01/21/2011
TECHNIQUE: Multiple axial images from the foramen magnum to the vertex were
obtained without IV contrast.

[Series 2: 1 soft tissue · axial · 0.40mm/px · z∈[-146,-11]mm · 15 of 30 slices shown, 19 images]
[im 2/30  brain]
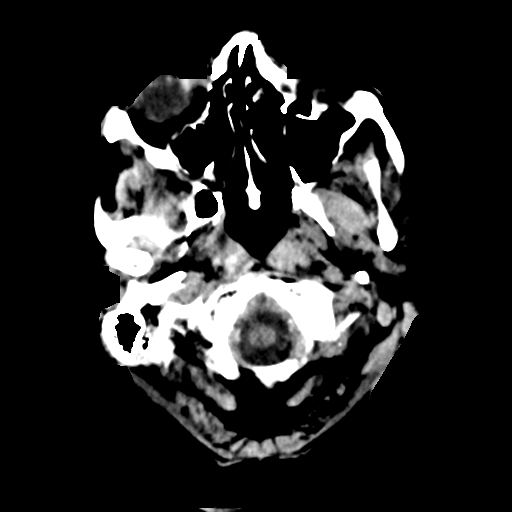
[im 2/30  bone]
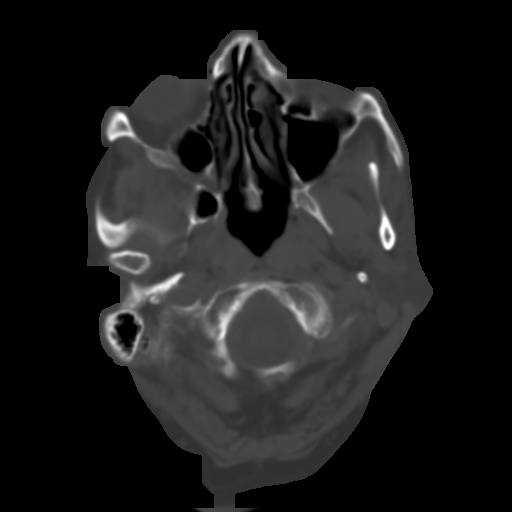
[im 4/30  brain]
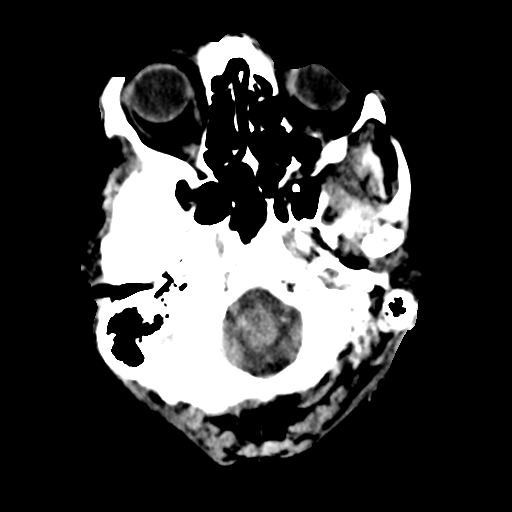
[im 6/30  brain]
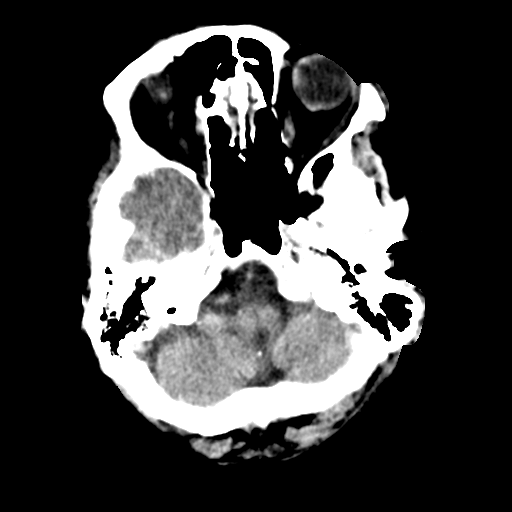
[im 8/30  brain]
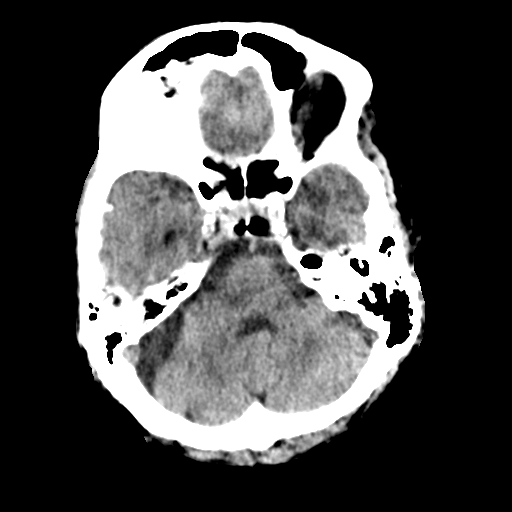
[im 10/30  brain]
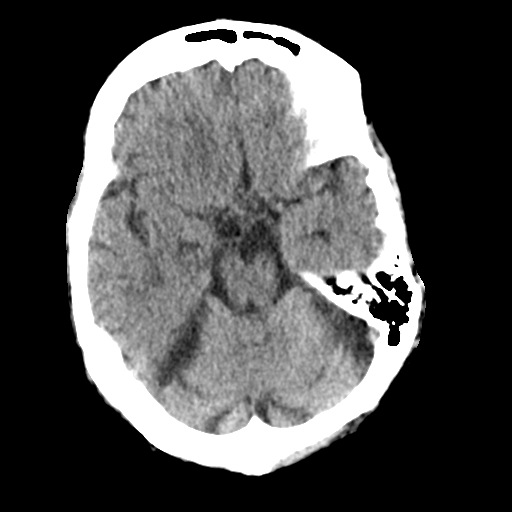
[im 10/30  bone]
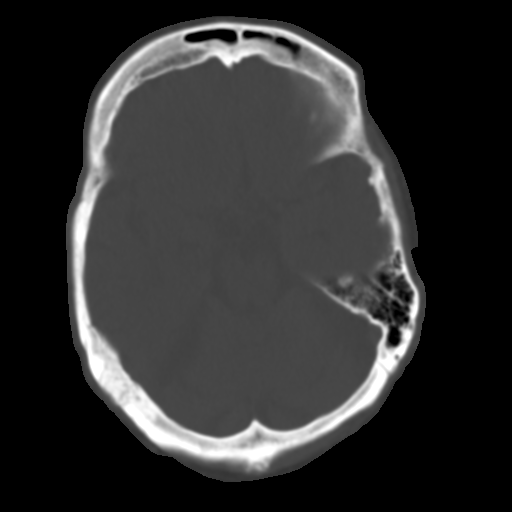
[im 12/30  brain]
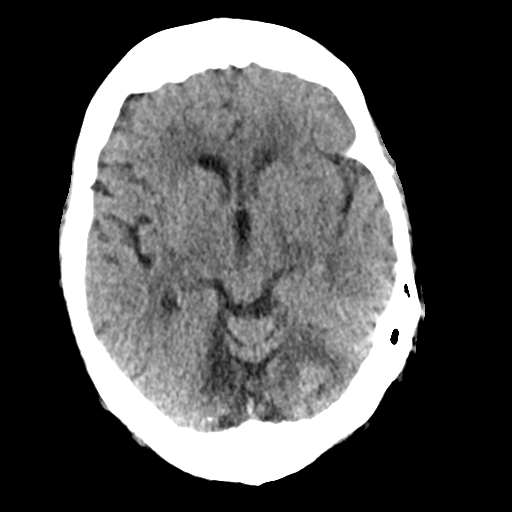
[im 14/30  brain]
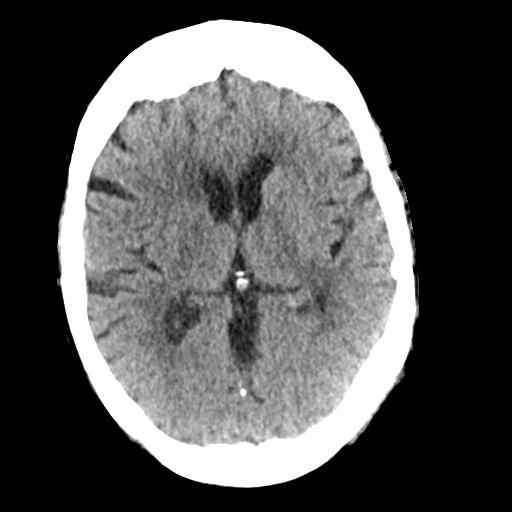
[im 16/30  brain]
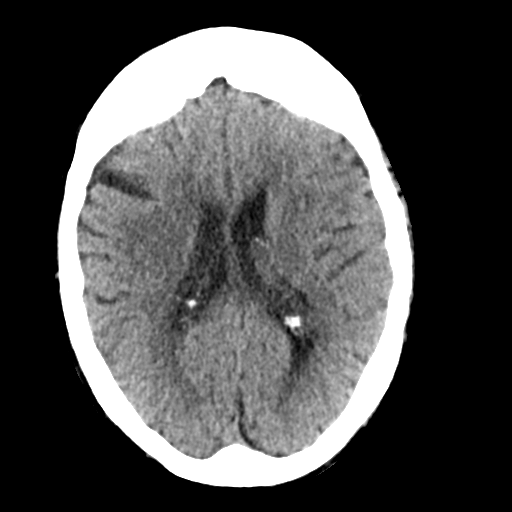
[im 17/30  brain]
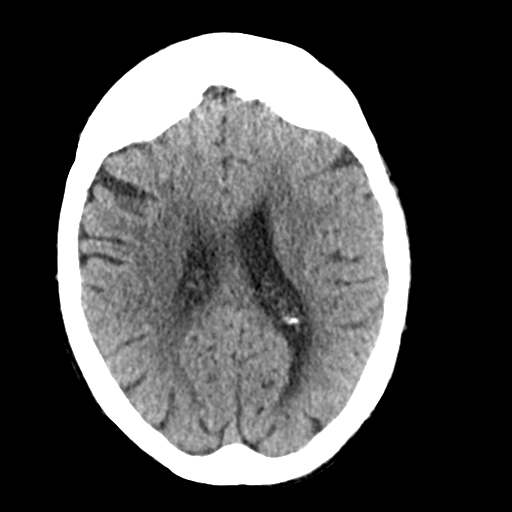
[im 17/30  bone]
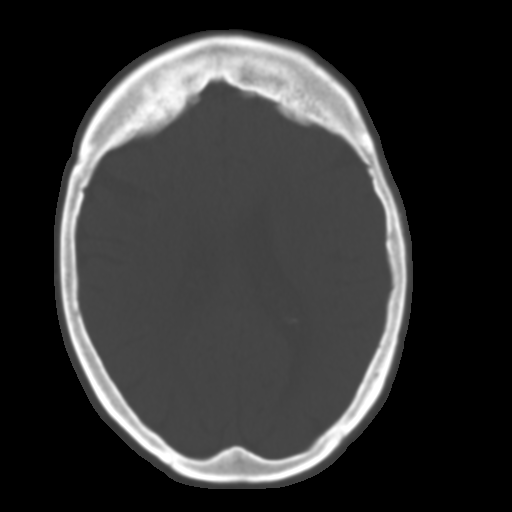
[im 19/30  brain]
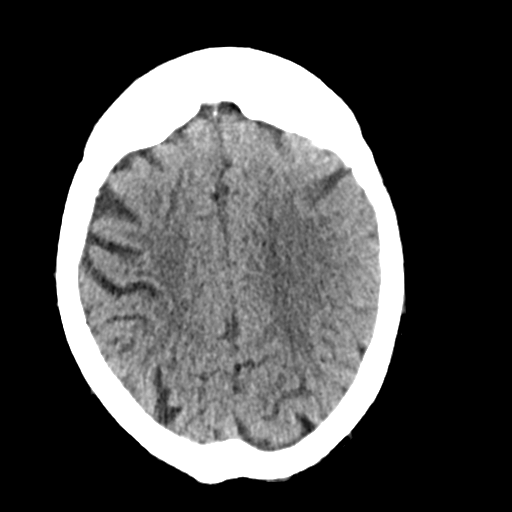
[im 21/30  brain]
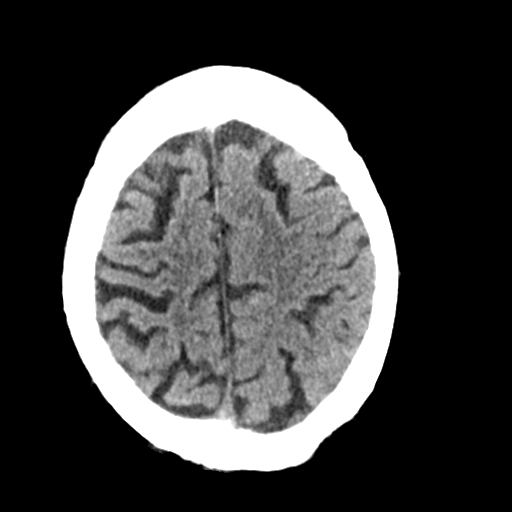
[im 23/30  brain]
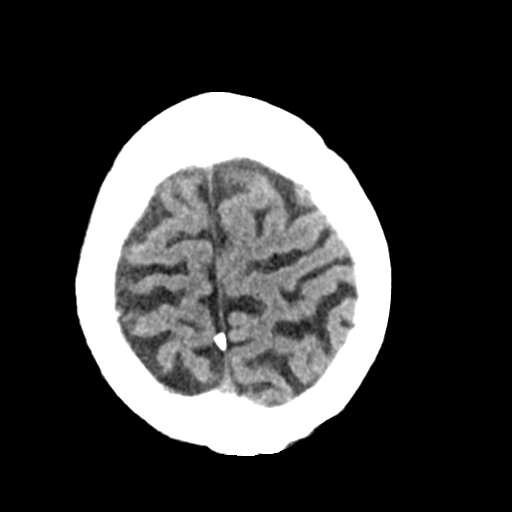
[im 25/30  brain]
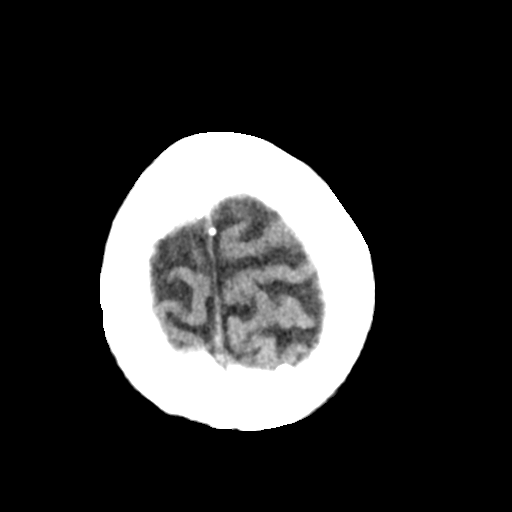
[im 25/30  bone]
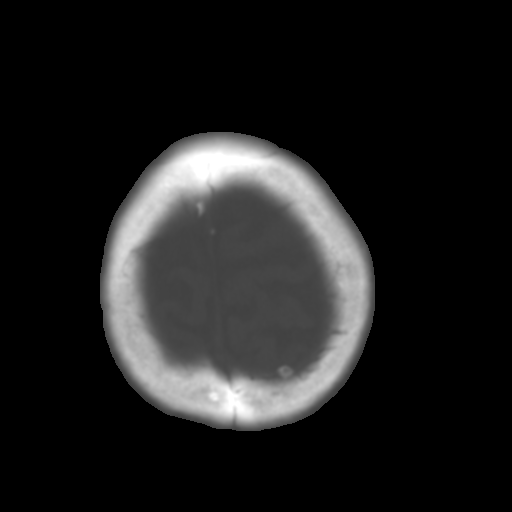
[im 27/30  brain]
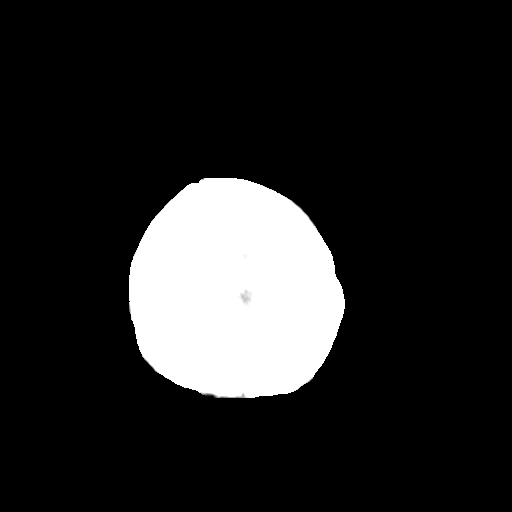
[im 29/30  brain]
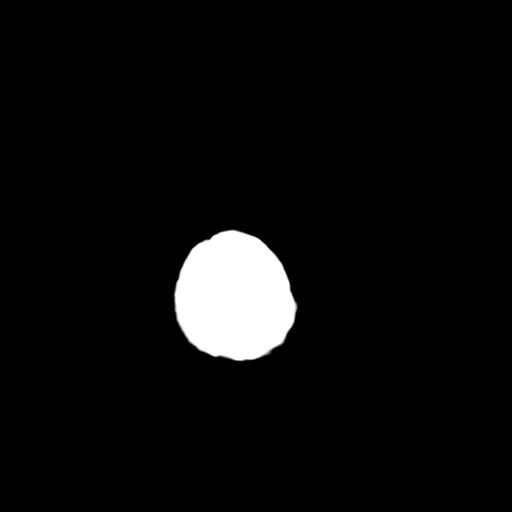

[15 of 30 positions shown; findings below may reference images not displayed]

FINDINGS: There is no evidence for mass effect, midline shift, or extra-axial fluid
collections. There is no evidence for space-occupying lesion, intracranial
hemorrhage, or cortical-based area of infarction. Periventricular and
subcortical hypoattenuation is consistent with chronic small vessel ischemic
disease.

There is a nonspecific subcentimeter calcific density along the inner table
of the left temporal skull. This could represent a small calcification along
the inner table of the skull versus a small calcified meningioma. It is
similar to prior.

The osseous structures are unremarkable.
IMPRESSION: 1. No acute intracranial process.
2. Chronic small vessel ischemic disease.

If there is continued clinical concern, further evaluation could be provided
with MRI.

## 2013-01-25 ENCOUNTER — Emergency Department: Payer: Self-pay | Admitting: Emergency Medicine

## 2013-01-25 LAB — CBC
HCT: 39.7 % (ref 35.0–47.0)
HGB: 12.8 g/dL (ref 12.0–16.0)
MCHC: 32.4 g/dL (ref 32.0–36.0)
MCV: 81 fL (ref 80–100)
RBC: 4.93 10*6/uL (ref 3.80–5.20)
WBC: 6.4 10*3/uL (ref 3.6–11.0)

## 2013-01-25 LAB — COMPREHENSIVE METABOLIC PANEL
Albumin: 3.6 g/dL (ref 3.4–5.0)
Alkaline Phosphatase: 65 U/L (ref 50–136)
Calcium, Total: 9.5 mg/dL (ref 8.5–10.1)
Co2: 29 mmol/L (ref 21–32)
Creatinine: 1.28 mg/dL (ref 0.60–1.30)
EGFR (Non-African Amer.): 38 — ABNORMAL LOW
Glucose: 213 mg/dL — ABNORMAL HIGH (ref 65–99)
Potassium: 4 mmol/L (ref 3.5–5.1)
SGPT (ALT): 18 U/L (ref 12–78)
Total Protein: 8 g/dL (ref 6.4–8.2)

## 2013-01-25 LAB — URINALYSIS, COMPLETE
Blood: NEGATIVE
Hyaline Cast: 2
Ketone: NEGATIVE
Nitrite: POSITIVE
Protein: 100
Specific Gravity: 1.017 (ref 1.003–1.030)
Squamous Epithelial: NONE SEEN

## 2013-01-25 LAB — APTT: Activated PTT: 57 secs — ABNORMAL HIGH (ref 23.6–35.9)

## 2013-01-25 LAB — PROTIME-INR: Prothrombin Time: 34 secs — ABNORMAL HIGH (ref 11.5–14.7)

## 2013-01-25 LAB — TROPONIN I: Troponin-I: <0.02 ng/mL

## 2013-02-11 DEATH — deceased

## 2013-06-03 ENCOUNTER — Ambulatory Visit: Payer: Self-pay | Admitting: Family Medicine

## 2013-06-03 DIAGNOSIS — Z86718 Personal history of other venous thrombosis and embolism: Secondary | ICD-10-CM

## 2014-01-31 IMAGING — CR DG CHEST 1V PORT
1 series · 1 of 1 positions shown · non-contrast
Comparison: none

REASON FOR EXAM: CVA
COMMENTS:

[ap]
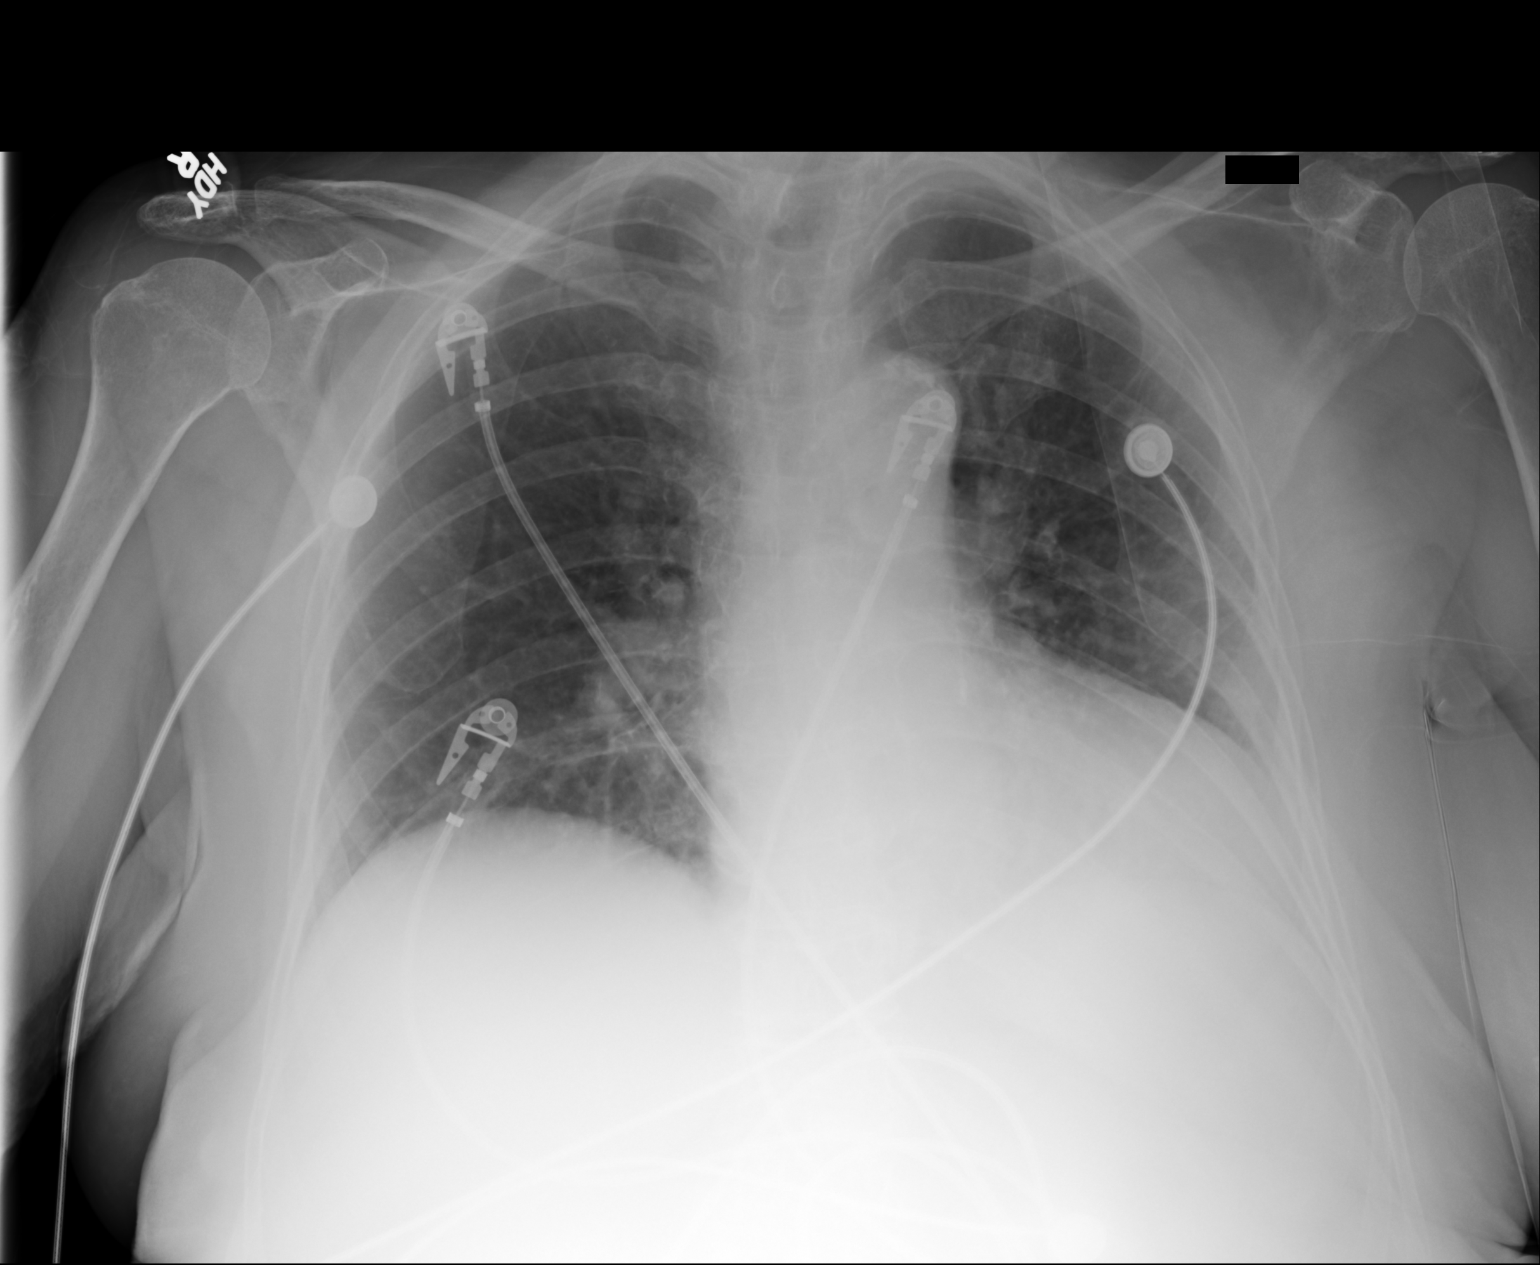

[1 of 1 positions shown; findings below may reference images not displayed]

PROCEDURE:     DXR - DXR PORTABLE CHEST SINGLE VIEW  - January 25, 2013  [DATE]

RESULT:     Comparison is made to study of 11/08/2012.

There is mild central pulmonary vascular prominence. The heart is borderline
to mildly enlarged. Atherosclerotic calcification is present. The left lung
base is poorly demonstrated. Followup PA and lateral views are recommended.
There is relative hypoinflation.
IMPRESSION: 1. Shallow inspiration. Followup PA and lateral views would be recommended
if the patient's condition permits.

[REDACTED]

## 2014-10-03 NOTE — H&P (Signed)
PATIENT NAME:  Margaret Haynes, Yuna L MR#:  [id number] DATE OF BIRTH:  [birth year]  DATE OF ADMISSION:  11/08/2012  REFERRING PHYSICIAN:  Daryel NovemberJonathan Williams, MD   PRIMARY CARE PHYSICIAN:  Beverely RisenFozia Khan, MD  CHIEF COMPLAINT:  Status post fall. The patient was observed last night, taken home and on the way home she was really confused and brought back to the ER.   HISTORY OF PRESENT ILLNESS:  The patient is a nice 81 year old female who has a history of hypertension, diabetes, hyperlipidemia, DJD, bilateral vein thrombosis and dementia, who comes with a history of being at home by herself most of the day. Her son lives with her, but he works during the day and only comes home at night. Her daughter comes to the house occasionally to check on her but overall the patient stays a long time in the house by herself. Within the last month, she has had falls. She has fallen 5 times. The patient apparently had a fall within a year ago where she fractured her right ankle and she had an open reduction and internal fixation by Dr. Rosita KeaMenz. After that she went to [facility] and she underwent rehabilitation there and was able to be sent home. Since then she was fine, better balance, has not been falling up until last month. Last night she was on the floor when here son arrived to the house, that was around 5:00 p.m. The patient, at that moment, said that she hurt her wrist and then after a while she said now my wrist does not hurt, it is my ankle and then her ankle does not hurt anymore. Apparently all the symptoms as far as the falling have been just getting worse and the family is very concerned. Yesterday she was evaluated and nothing really seem to be wrong with her. Her x-rays including ankle were normal without any fractures. Her pelvis did not show any acute abnormalities of fractures. Her blood work was done and not showing any signs of infection. She did not have a chest x-ray that I can see here. Her EKG was normal sinus  rhythm. She has been afebrile. No signs of urinary infection, no signs of other infection, may be slightly dehydrated clinically, with a slight increase in BUN of 20 and low platelets. The patient is admitted for further observation and placement. The family is very concerned about this fall since the patient has Coumadin. A CT scan of the head was done to rule out any bleeding and there is negative. Cannot rule out the possibility of a stroke or other pathology. The patient was going to be taken home, but she is really confused. She is in acute delirium and not recognizing anyone. This is likely due to her dementia and the fact that she spent the night in the hospital, being delirious due to the confusion from being in a strange place, but the family wants to make sure, and then possibly figure out some type of placement for her.   REVIEW OF SYSTEMS:  Unable to obtain review of systems as the patient is demented and not noncooperative.    PAST MEDICAL HISTORY:  1.  Diabetes type 2, non-insulin-dependent, well-controlled.  2.  Hypertension.  3.  Hyperlipidemia.  4.  Bilateral DVT 2 years ago on Coumadin.  5.  Osteoarthritis.  6.  DJD.  7.  Dementia. As a side note, as far as her dementia, she is being started on a medication, the family does not know the name,  but she took it for 6 weeks and since she was not getting better and actually getting worse, the family stopped it. They do not know the name of that medication.   PAST SURGICAL HISTORY:  1.  Right ankle open reduction and internal fixation a year ago.  2.  Hysterectomy.  3.  Bilateral cataract surgery.   FAMILY HISTORY:  A history of cancer in one of her brothers. The family cannot tell me which type of cancer and the patient cannot give me more information.   SOCIAL HISTORY:  She lives with her son. She spends most of the time at home by herself. Her son comes at home at night. She has never smoked. She does not drink.   ALLERGIES:  No  known drug allergies.   CURRENT MEDICATIONS:  Include warfarin 6 mg once day except for Wednesday and Saturday. The patient takes 3 mg vitamin D once a day, glimepride 1 mg once daily, fish 1000 mg twice daily, Coreg 625 mg twice daily, calcium carbonate twice daily, Tylenol p.r.n.   PHYSICAL EXAMINATION:  VITAL SIGNS:  Blood pressure 117/67, pulse 88, respirations 20, temperature 97.2. On my physical examination actually her heart rate is in the mid 90s with occasional PVCs and a little bit of a gallop.  GENERAL:  The patient is lethargic, awakes to the stimulus but she is confused and not communicative, noncooperative. HEENT:  Her pupils are equal and reactive. Extraocular movements are intact. Mucosa are moist. Anicteric sclerae. Pink conjunctivae. No oral lesions. No oropharyngeal exudate. Crusted eyes but no purulent secretions. Oral mucosas are dry. No oral lesions. No thrush.  NECK:  Supple. No JVD. No thyromegaly. No adenopathy. No carotid bruits. No rigidity. No masses.  CARDIOVASCULAR:  Regular rate and rhythm with a gallop. Positive occasional PVCs. Positive S3. Positive systolic ejection murmur, which is 2 or 3/6, which is a new murmur for the patient. No tenderness anterior chest wall. No displacement of PMI.  LUNGS:  Showing some mild decrease of respiratory sounds on lower lobes. No rhonchi. There is no significant crackles. No use of accessory muscles.  ABDOMEN:  Soft, nontender, nondistended, no hepatosplenomegaly. No masses. Bowel sounds are positive. No rebound.  GENITAL:  Negative for external lesions.  EXTREMITIES:  No edema, cyanosis or clubbing. Pulses +2. Capillary refill less than 3.  MUSCULOSKELETAL:  No significant joint abnormalities, deformities or effusions.  SKIN:  Without any rashes or petechiae. Positive decreased turgor and dehydration. The skin is very dry. LYMPHATICS:  Negative for lymphadenopathy in neck or supraclavicular areas.  VASCULAR:  Capillary refill  around 3 and pulses +2.  NEUROLOGIC:  Cranial nerves II through XII intact. Strength seems to be equal in 4 extremities but the patient is very uncooperative, unable to see any significant motor defect, but she is moving 4 extremities. Sensation seems to be normal. The patient withdraws to pain, unable to do cerebellar testing.  PSYCHIATRIC:  The patient is delirious, noncooperative and trying to pull her IVs.  LABORATORY, DIAGNOSTIC AND RADIOLOGICAL DATA:  Glucose 123, BUN 20, creatinine 1.19, potassium 3.6, sodium 138. LFTs within normal limits. Troponin is negative. White count is 9.6, hemoglobin 12, platelets 131. INR is 2.0. On urinalysis trace leukocyte esterase.   EKG:  Sinus rhythm.   ASSESSMENT AND PLAN:  An 81 year old female with a history of dementia, hypertension, diabetes, hyperlipidemia, multiple falls within the past month with significant decline of her mental status admitted for observation of all the symptoms since the  patient is delirious today.  1.  Delirium:  The patient is likely to be delirious based on status post fall being brought to the hospital on different environment. The patient does not seem to be a clear focus of infection or fever for what I think this is just related to her dementia and sundowning. Because of acute of the symptoms happening just overnight and because of the progression of these disease, and certain findings on the CT scan, we are going to do further workup. 2.  The patient had three white blood cells in urine and trace leukocyte esterase. I am going to send the urine for culture.  3.  The patient has not had a chest x-ray and she has significant decrease of respiratory sounds in both lower lobes. I am going to get an x-ray right now, stat.  4.  The patient has multiple falls within the past 5 months. She is on Coumadin. No signs of bleeding on the CT scan, but we are going to get an MRI to rule out any possible small strokes, especially cerebellar. We  also are going to get full testing if necessary, but for now, we are just going to stick with the MRI. We are going to get an echocardiogram as the patient has a new murmur and she has a gallop and has some new PVCs.  5.  The murmur could be hemodynamically as the patient is dehydrated, but we will see tomorrow if after some hydration it is still there.  6.  Most likely this is dementia and she will need some placement. We are going to get Child psychotherapist and case manager to work on possible assisted living facility versus Home Health at home, but the patient is not safe at home by herself for so many hours during the day.  7.  Hypertension. The patient has well-controlled blood pressure. Continue Coreg.  8.  Diabetes. Continue insulin sliding scale and glimepride.  9.  Hyperlipidemia. Check lipid profile.  10.  A history of bilateral deep venous thromboses. The patient is on Coumadin. The patient is having multiple falls within the past month. At this moment, I am going to continue heparin but the family needs to decide if this is worth to her on Coumadin. I will advocate for stopping Coumadin all at once and treating her only with aspirin. The patient is high risk of falls and high risk of having significant complications like bleeding. The family is going to talk to the older sisters about this issue and about the issue of changing her from FULL CODE to DO NOT RESUSCITATE. I am going to ask Palliative Care consult also to address this issue.   CODE STATUS:  THE PATIENT IS A FULL CODE.  11.  Gastrointestinal prophylaxis with proton pump inhibitor.   TIME SPENT:  I spent about 50 minutes with this patient.  ____________________________ Felipa Furnace, MD rsg:jm D: 11/08/2012 14:22:31 ET T: 11/08/2012 15:01:33 ET JOB#: [id number]  cc: Felipa Furnace, MD, <Dictator> Ausha Sieh Juanda Chance MD ELECTRONICALLY SIGNED 11/19/2012 1:46

## 2014-10-03 NOTE — Discharge Summary (Signed)
PATIENT NAME:  Margaret Haynes, Easter L MR#:  [id number] DATE OF BIRTH:  1927-05-30  DATE OF ADMISSION:  11/08/2012  DATE OF DISCHARGE: 11/09/2012.   ADMISSION DIAGNOSIS:  Altered mental status.   DISCHARGE DIAGNOSIS:  1. Altered mental status with delirium, likely secondary to acute exacerbation of her underlying dementia.  2. Hyperkalemia.  3. Hypertension.  4. Diabetes.  5. History of deep vein thrombosis on chronic coumadin due to 2 DVTS in past  CONSULTS: Palliative care.   LABORATORIES AT DISCHARGE:  White blood cells 6.1, hemoglobin 11.2, hematocrit 33.2, platelets 109.  LDL 165, VLDL 21, HDL 49, triglycerides 103, cholesterol 235. TSH is 1.85. CT of the head showed no acute intracranial hemorrhage or CVA. Urine culture; no growth.  Left ankle x-ray; no fracture.   An 81 year old female with a history of dementia, who presented after a fall, was found to have worsening of her underlying dementia. For further details, please refer to the history and physical.   Altered mental status after fall with delirium, likely secondary to exacerbation for dementia. She was in the ER for a long time which probably worsened her underlying dementia, just due to the new environment. She was admitted for observation. She seems to be at her baseline. Her urine culture was negative. No clinical signs of infection.    Hypokalemia repleted.   Hypertension. The patient is on Coreg.  Her blood pressure is slightly elevated today. The patient will need this monitored as an outpatient.  Diabetes on amiodarone.  History of deep venous thrombosis on Coumadin for 2 years.  She is high risk of falls and bleeds which the family is aware of right now since she is going to rehab they would like for to continue this patient will need very close monitring of her INR goal 2-3.   DISCHARGE MEDICATIONS: 1. Glimepiride 1 mg daily.  2. Fish oil 1 tablet b.i.d.  3. Calcium 1 tablet b.i.d.  4. Vitamin D3 1 tablet daily.   5. Tylenol 500 mg 2 tablets b.i.d.  6. Coreg 6.25 b.i.d.   DISCHARGE DIET: Low sodium, carbohydrate, ADA diet.   DISCHARGE ACTIVITY: As tolerated.   DISCHARGE REFERRAL: Physical therapy.   DISCHARGE FOLLOWUP:   The patient will follow up with Colleen Harris-Gutierrez at [phone] in 1 to 2 weeks. Please check blood pressure daily and monitor blood sugars per facility and sliding scale before meals. Please check INR atleast every 2 days goal INR 2-3. care with falls.  TIME SPENT:  Approximately 35 minutes.    ____________________________ Janyth ContesSital P. Juliene PinaMody, MD spm:rw D: 11/09/2012 14:48:06 ET T: 11/09/2012 15:10:40 ET JOB#: [id number]  cc: Karielle Davidow P. Juliene PinaMody, MD, <Dictator> Colleen L. Sharen HonesGutierrez, CNM Lorraine Terriquez P Mikenzie Mccannon MD ELECTRONICALLY SIGNED 11/09/2012 15:52
